# Patient Record
Sex: Male | Born: 1955 | Race: White | Hispanic: No | Marital: Married | Smoking: Never smoker
Health system: Southern US, Community
[De-identification: ages and names within clinical notes are randomized; demographics above are authoritative.]

## PROBLEM LIST (undated history)

## (undated) DIAGNOSIS — Z87442 Personal history of urinary calculi: Secondary | ICD-10-CM

## (undated) DIAGNOSIS — I517 Cardiomegaly: Secondary | ICD-10-CM

## (undated) DIAGNOSIS — F988 Other specified behavioral and emotional disorders with onset usually occurring in childhood and adolescence: Secondary | ICD-10-CM

## (undated) DIAGNOSIS — G4730 Sleep apnea, unspecified: Secondary | ICD-10-CM

## (undated) DIAGNOSIS — K219 Gastro-esophageal reflux disease without esophagitis: Secondary | ICD-10-CM

## (undated) DIAGNOSIS — F32A Depression, unspecified: Secondary | ICD-10-CM

## (undated) DIAGNOSIS — I872 Venous insufficiency (chronic) (peripheral): Secondary | ICD-10-CM

## (undated) DIAGNOSIS — I10 Essential (primary) hypertension: Secondary | ICD-10-CM

## (undated) DIAGNOSIS — E349 Endocrine disorder, unspecified: Secondary | ICD-10-CM

## (undated) DIAGNOSIS — F329 Major depressive disorder, single episode, unspecified: Secondary | ICD-10-CM

## (undated) DIAGNOSIS — N200 Calculus of kidney: Secondary | ICD-10-CM

## (undated) DIAGNOSIS — M1990 Unspecified osteoarthritis, unspecified site: Secondary | ICD-10-CM

## (undated) DIAGNOSIS — N400 Enlarged prostate without lower urinary tract symptoms: Secondary | ICD-10-CM

## (undated) HISTORY — DX: Gastro-esophageal reflux disease without esophagitis: K21.9

## (undated) HISTORY — PX: APPENDECTOMY: SHX54

## (undated) HISTORY — PX: OTHER SURGICAL HISTORY: SHX169

---

## 2001-04-02 HISTORY — PX: LITHOTRIPSY: SUR834

## 2007-04-03 HISTORY — PX: HERNIA REPAIR: SHX51

## 2008-02-12 ENCOUNTER — Observation Stay: Payer: Self-pay | Admitting: General Surgery

## 2008-11-18 ENCOUNTER — Ambulatory Visit: Payer: Self-pay | Admitting: Urology

## 2009-07-11 ENCOUNTER — Ambulatory Visit: Payer: Self-pay | Admitting: General Practice

## 2009-07-15 ENCOUNTER — Ambulatory Visit: Payer: Self-pay | Admitting: General Practice

## 2009-07-19 ENCOUNTER — Ambulatory Visit: Payer: Self-pay | Admitting: Urology

## 2011-09-18 ENCOUNTER — Ambulatory Visit: Payer: Self-pay | Admitting: Emergency Medicine

## 2011-12-28 ENCOUNTER — Ambulatory Visit: Payer: Self-pay | Admitting: Family Medicine

## 2012-02-07 DIAGNOSIS — N411 Chronic prostatitis: Secondary | ICD-10-CM | POA: Insufficient documentation

## 2013-03-21 ENCOUNTER — Emergency Department: Payer: Self-pay | Admitting: Emergency Medicine

## 2013-03-21 LAB — TROPONIN I: Troponin-I: <0.02 ng/mL

## 2013-03-21 LAB — COMPREHENSIVE METABOLIC PANEL
BUN: 15 mg/dL (ref 7–18)
Calcium, Total: 9 mg/dL (ref 8.5–10.1)
Chloride: 105 mmol/L (ref 98–107)
Co2: 30 mmol/L (ref 21–32)
EGFR (African American): >60
EGFR (Non-African Amer.): >60
Glucose: 103 mg/dL — ABNORMAL HIGH (ref 65–99)
Osmolality: 279 (ref 275–301)
SGOT(AST): 13 U/L — ABNORMAL LOW (ref 15–37)
SGPT (ALT): 24 U/L (ref 12–78)
Total Protein: 6.2 g/dL — ABNORMAL LOW (ref 6.4–8.2)

## 2013-03-21 LAB — PRO B NATRIURETIC PEPTIDE: B-Type Natriuretic Peptide: 21 pg/mL (ref 0–125)

## 2013-03-21 LAB — CBC
HCT: 40.8 % (ref 40.0–52.0)
HGB: 13.9 g/dL (ref 13.0–18.0)
MCH: 30 pg (ref 26.0–34.0)
MCHC: 34 g/dL (ref 32.0–36.0)
MCV: 88 fL (ref 80–100)
RBC: 4.61 10*6/uL (ref 4.40–5.90)
RDW: 13.3 % (ref 11.5–14.5)

## 2013-03-21 LAB — CK TOTAL AND CKMB (NOT AT ARMC): CK-MB: 4.4 ng/mL — ABNORMAL HIGH (ref 0.5–3.6)

## 2013-04-17 DIAGNOSIS — Z79899 Other long term (current) drug therapy: Secondary | ICD-10-CM | POA: Insufficient documentation

## 2013-05-03 HISTORY — PX: JOINT REPLACEMENT: SHX530

## 2013-05-15 ENCOUNTER — Ambulatory Visit: Payer: Self-pay | Admitting: Specialist

## 2013-05-16 ENCOUNTER — Emergency Department: Payer: Self-pay | Admitting: Emergency Medicine

## 2013-05-16 LAB — BASIC METABOLIC PANEL
Anion Gap: 2 — ABNORMAL LOW (ref 7–16)
BUN: 16 mg/dL (ref 7–18)
Calcium, Total: 8.8 mg/dL (ref 8.5–10.1)
Chloride: 107 mmol/L (ref 98–107)
Co2: 31 mmol/L (ref 21–32)
Creatinine: 1.35 mg/dL — ABNORMAL HIGH (ref 0.60–1.30)
EGFR (African American): >60
EGFR (Non-African Amer.): 59 — ABNORMAL LOW
Glucose: 88 mg/dL (ref 65–99)
Osmolality: 280 (ref 275–301)
Potassium: 3.7 mmol/L (ref 3.5–5.1)
Sodium: 140 mmol/L (ref 136–145)

## 2013-05-16 LAB — CBC WITH DIFFERENTIAL/PLATELET
Basophil #: 0 x10 3/mm 3
Basophil %: 0.3 %
Eosinophil #: 0.2 x10 3/mm 3
Eosinophil %: 1.5 %
HCT: 38.8 % — ABNORMAL LOW
HGB: 13 g/dL
Lymphocyte %: 10.5 %
Lymphs Abs: 1.2 x10 3/mm 3
MCH: 30.4 pg
MCHC: 33.5 g/dL
MCV: 91 fL
Monocyte #: 1.2 "x10 3/mm " — ABNORMAL HIGH
Monocyte %: 10.1 %
Neutrophil #: 9.2 x10 3/mm 3 — ABNORMAL HIGH
Neutrophil %: 77.6 %
Platelet: 233 x10 3/mm 3
RBC: 4.27 x10 6/mm 3 — ABNORMAL LOW
RDW: 13.6 %
WBC: 11.8 x10 3/mm 3 — ABNORMAL HIGH

## 2013-05-17 ENCOUNTER — Emergency Department: Payer: Self-pay | Admitting: Emergency Medicine

## 2013-05-17 LAB — CBC WITH DIFFERENTIAL/PLATELET
BASOS ABS: 0.1 10*3/uL (ref 0.0–0.1)
BASOS PCT: 0.7 %
EOS ABS: 0.1 10*3/uL (ref 0.0–0.7)
EOS PCT: 1.1 %
HCT: 37.6 % — ABNORMAL LOW (ref 40.0–52.0)
HGB: 12.8 g/dL — ABNORMAL LOW (ref 13.0–18.0)
Lymphocyte #: 1.2 10*3/uL (ref 1.0–3.6)
Lymphocyte %: 10.5 %
MCH: 30.7 pg (ref 26.0–34.0)
MCHC: 34.1 g/dL (ref 32.0–36.0)
MCV: 90 fL (ref 80–100)
MONOS PCT: 8.1 %
Monocyte #: 0.9 x10 3/mm (ref 0.2–1.0)
Neutrophil #: 8.7 10*3/uL — ABNORMAL HIGH (ref 1.4–6.5)
Neutrophil %: 79.6 %
PLATELETS: 232 10*3/uL (ref 150–440)
RBC: 4.18 10*6/uL — ABNORMAL LOW (ref 4.40–5.90)
RDW: 13.5 % (ref 11.5–14.5)
WBC: 11 10*3/uL — ABNORMAL HIGH (ref 3.8–10.6)

## 2013-05-21 LAB — CULTURE, BLOOD (SINGLE)

## 2013-08-06 DIAGNOSIS — R1310 Dysphagia, unspecified: Secondary | ICD-10-CM | POA: Insufficient documentation

## 2013-08-06 DIAGNOSIS — N200 Calculus of kidney: Secondary | ICD-10-CM | POA: Insufficient documentation

## 2013-08-06 DIAGNOSIS — G4730 Sleep apnea, unspecified: Secondary | ICD-10-CM | POA: Insufficient documentation

## 2013-08-06 DIAGNOSIS — F32A Depression, unspecified: Secondary | ICD-10-CM | POA: Insufficient documentation

## 2013-08-06 DIAGNOSIS — F988 Other specified behavioral and emotional disorders with onset usually occurring in childhood and adolescence: Secondary | ICD-10-CM | POA: Insufficient documentation

## 2013-09-08 ENCOUNTER — Ambulatory Visit: Payer: Self-pay | Admitting: Gastroenterology

## 2013-09-10 LAB — PATHOLOGY REPORT

## 2013-12-24 ENCOUNTER — Other Ambulatory Visit: Payer: Self-pay | Admitting: Orthopaedic Surgery

## 2013-12-24 DIAGNOSIS — M25511 Pain in right shoulder: Secondary | ICD-10-CM

## 2013-12-28 ENCOUNTER — Ambulatory Visit
Admission: RE | Admit: 2013-12-28 | Discharge: 2013-12-28 | Disposition: A | Discharge status: Home / Self Care | Payer: BC Managed Care – PPO | Source: Ambulatory Visit | Attending: Orthopaedic Surgery | Admitting: Orthopaedic Surgery

## 2013-12-28 ENCOUNTER — Other Ambulatory Visit: Payer: Self-pay | Admitting: Orthopaedic Surgery

## 2013-12-28 DIAGNOSIS — Z77018 Contact with and (suspected) exposure to other hazardous metals: Secondary | ICD-10-CM

## 2013-12-28 DIAGNOSIS — M25511 Pain in right shoulder: Secondary | ICD-10-CM

## 2014-02-12 DIAGNOSIS — E782 Mixed hyperlipidemia: Secondary | ICD-10-CM | POA: Insufficient documentation

## 2014-02-12 DIAGNOSIS — I517 Cardiomegaly: Secondary | ICD-10-CM

## 2014-02-12 DIAGNOSIS — I10 Essential (primary) hypertension: Secondary | ICD-10-CM | POA: Insufficient documentation

## 2014-02-12 HISTORY — DX: Cardiomegaly: I51.7

## 2014-05-03 DIAGNOSIS — M75102 Unspecified rotator cuff tear or rupture of left shoulder, not specified as traumatic: Secondary | ICD-10-CM | POA: Insufficient documentation

## 2014-07-24 NOTE — Op Note (Signed)
PATIENT NAME:  Brett Vance, RUETH MR#:  [id number] DATE OF BIRTH:  December 14, 1955  DATE OF PROCEDURE:  05/15/2013  PREOPERATIVE DIAGNOSIS: Degenerative arthritis, first metacarpocarpal joint left thumb.   POSTOPERATIVE DIAGNOSIS: Degenerative arthritis, first metacarpocarpal joint left thumb.   PROCEDURE PERFORMED: Excisional trapezium arthroplasty, left thumb.   SURGEON: Christophe Louis, M.D.   ANESTHESIA: General.   COMPLICATIONS: None.   TOURNIQUET TIME: 68 minutes.   DESCRIPTION OF PROCEDURE: Ancef 1 gram was given intravenously prior to the procedure. General anesthesia is induced. Left upper extremity is thoroughly prepped with alcohol and ChloraPrep and draped in standard sterile fashion. The extremity is wrapped out with the Esmarch bandage and pneumatic tourniquet elevated to 300 mmHg. Under loupe magnification, dorsal incision is made over the first metacarpocarpal joint. The dissection is carefully carried down to the capsule with preservation of the cutaneous nerves. Tendon is reflected to the side. Longitudinal incision is then made in the capsule, and the capsule is spared for later repair. The trapezium is identified. Central drill hole is made in the trapezium using the TPS bur. The entire trapezium is then removed piecemeal using the rongeur. Careful palpation demonstrates no residual evidence of bone. The wound is thoroughly irrigated multiple times. Three pieces of Gelfoam are then folded and compressed into the shape of a trapezium and sewn together with 3-0 Vicryl. This is then secured into the wound in the same position as the trapezium and secured and sewn down to the tendon at the base of the incision. Skin edges are then infiltrated with 0.5% plain Marcaine. The capsule is carefully repaired with 3-0 Vicryl. Several small 5-0 Vicryl sutures are placed subcutaneously. The skin is closed with a running subcuticular 3-0 Prolene suture. Soft bulky dressing is applied with a thumb  spica splint with the thumb in the appropriate position. The tourniquet is released, and the patient is returned to the recovery room in satisfactory condition having tolerated the procedure quite well.   ____________________________ Lucas Mallow, MD ces:jcm D: 05/15/2013 17:06:54 ET T: 05/15/2013 18:07:17 ET JOB#: [id number]  cc: Lucas Mallow, MD, <Dictator> Lucas Mallow MD ELECTRONICALLY SIGNED 05/18/2013 10:02

## 2015-02-04 DIAGNOSIS — M7582 Other shoulder lesions, left shoulder: Secondary | ICD-10-CM | POA: Insufficient documentation

## 2015-02-06 ENCOUNTER — Emergency Department
Admission: EM | Admit: 2015-02-06 | Discharge: 2015-02-06 | Disposition: A | Discharge status: Home / Self Care | Payer: BLUE CROSS/BLUE SHIELD | Attending: Emergency Medicine | Admitting: Emergency Medicine

## 2015-02-06 ENCOUNTER — Encounter: Payer: Self-pay | Admitting: Emergency Medicine

## 2015-02-06 DIAGNOSIS — S0501XA Injury of conjunctiva and corneal abrasion without foreign body, right eye, initial encounter: Secondary | ICD-10-CM | POA: Diagnosis not present

## 2015-02-06 DIAGNOSIS — X58XXXA Exposure to other specified factors, initial encounter: Secondary | ICD-10-CM | POA: Diagnosis not present

## 2015-02-06 DIAGNOSIS — I10 Essential (primary) hypertension: Secondary | ICD-10-CM | POA: Insufficient documentation

## 2015-02-06 DIAGNOSIS — Y998 Other external cause status: Secondary | ICD-10-CM | POA: Diagnosis not present

## 2015-02-06 DIAGNOSIS — S0591XA Unspecified injury of right eye and orbit, initial encounter: Secondary | ICD-10-CM | POA: Diagnosis present

## 2015-02-06 DIAGNOSIS — Y9389 Activity, other specified: Secondary | ICD-10-CM | POA: Insufficient documentation

## 2015-02-06 DIAGNOSIS — Y9289 Other specified places as the place of occurrence of the external cause: Secondary | ICD-10-CM | POA: Diagnosis not present

## 2015-02-06 HISTORY — DX: Essential (primary) hypertension: I10

## 2015-02-06 MED ORDER — EYE WASH OPHTH SOLN
OPHTHALMIC | Status: AC
  Filled 2015-02-06: qty 118

## 2015-02-06 MED ORDER — FLUORESCEIN SODIUM 1 MG OP STRP
ORAL_STRIP | OPHTHALMIC | Status: AC
  Filled 2015-02-06: qty 1

## 2015-02-06 MED ORDER — TETRACAINE HCL 0.5 % OP SOLN
OPHTHALMIC | Status: AC
  Filled 2015-02-06: qty 2

## 2015-02-06 MED ORDER — HYDROCODONE-ACETAMINOPHEN 5-325 MG PO TABS: 1.0000 | ORAL_TABLET | ORAL | Status: DC | PRN

## 2015-02-06 MED ORDER — SULFACETAMIDE SODIUM 10 % OP SOLN: 2.0000 [drp] | Freq: Four times a day (QID) | OPHTHALMIC | Status: DC

## 2015-02-06 NOTE — Discharge Instructions (Signed)

## 2015-02-06 NOTE — ED Provider Notes (Signed)
[facility] Emergency Department Provider Note  ____________________________________________  Time seen: Approximately 5:26 PM  I have reviewed the triage vital signs and the nursing notes.   HISTORY  Chief Complaint Eye Injury   HPI Brett Vance. is a 59 y.o. male who presents emergency room for evaluation of right eye blurriness and pain secondary to poking himself in the eye with a dried flower stop. Patient denies any other complaints at this time.   Past Medical History  Diagnosis Date  . Hypertension     There are no active problems to display for this patient.   No past surgical history on file.  Current Outpatient Rx  Name  Route  Sig  Dispense  Refill  . HYDROcodone-acetaminophen (NORCO) 5-325 MG tablet   Oral   Take 1-2 tablets by mouth every 4 (four) hours as needed for moderate pain.   15 tablet   0   . sulfacetamide (BLEPH-10) 10 % ophthalmic solution   Right Eye   Place 2 drops into the right eye 4 (four) times daily.   5 mL   0     Allergies Review of patient's allergies indicates no known allergies.  No family history on file.  Social History Social History  Substance Use Topics  . Smoking status: Never Smoker   . Smokeless tobacco: Never Used  . Alcohol Use: No    Review of Systems Constitutional: No fever/chills Eyes: Positive visual changes right eye. ENT: No sore throat. Cardiovascular: Denies chest pain. Respiratory: Denies shortness of breath. Gastrointestinal: No abdominal pain.  No nausea, no vomiting.  No diarrhea.  No constipation. Genitourinary: Negative for dysuria. Musculoskeletal: Negative for back pain. Skin: Negative for rash. Neurological: Negative for headaches, focal weakness or numbness.  10-point ROS otherwise negative.  ____________________________________________   PHYSICAL EXAM:  VITAL SIGNS: ED Triage Vitals  Enc Vitals Group     BP 02/06/15 1651 154/76 mmHg     Pulse  Rate 02/06/15 1651 89     Resp 02/06/15 1651 18     Temp 02/06/15 1651 98.8 F (37.1 C)     Temp Source 02/06/15 1651 Oral     SpO2 02/06/15 1651 98 %     Weight 02/06/15 1651 170 lb (77.111 kg)     Height 02/06/15 1651 5\' 10"  (1.778 m)     Head Cir --      Peak Flow --      Pain Score 02/06/15 1700 2     Pain Loc --      Pain Edu? --      Excl. in [place]? --     Constitutional: Alert and oriented. Well appearing and in no acute distress. Eyes: Conjunctivae are normal. PERRL. EOMI. Head: Atraumatic. Nose: No congestion/rhinnorhea. Mouth/Throat: Mucous membranes are moist.  Oropharynx non-erythematous. Neck: No stridor.   Cardiovascular: Normal rate, regular rhythm. Grossly normal heart sounds.  Good peripheral circulation. Respiratory: Normal respiratory effort.  No retractions. Lungs CTAB. Gastrointestinal: Soft and nontender. No distention. No abdominal bruits. No CVA tenderness. Musculoskeletal: No lower extremity tenderness nor edema.  No joint effusions. Neurologic:  Normal speech and language. No gross focal neurologic deficits are appreciated. No gait instability. Skin:  Skin is warm, dry and intact. No rash noted. Psychiatric: Mood and affect are normal. Speech and behavior are normal.  ____________________________________________   LABS (all labs ordered are listed, but only abnormal results are displayed)  Labs Reviewed - No data to display ____________________________________________  PROCEDURES  Procedure(s) performed: YES Tetracaine eyedrops applied, fluorescein strip applied visualized right eye with Woods lamp. Corneal abrasion extending from approximately 9:00 to 4:00 noted. Eye irrigated with approximately 15 cc of normal saline eyewash.  Critical Care performed: No  ____________________________________________   INITIAL IMPRESSION / ASSESSMENT AND PLAN / ED COURSE  Pertinent labs & imaging results that were available during my care of the patient  were reviewed by me and considered in my medical decision making (see chart for details).  Acute corneal abrasion. Rx given for sodium Sulamyd eyedrops as well as hydrocodone as needed for pain. Patient to follow up with PCP or ophthalmology as needed. Patient voices no other emergency medical complaints at this time. ____________________________________________   FINAL CLINICAL IMPRESSION(S) / ED DIAGNOSES  Final diagnoses:  Corneal abrasion, right, initial encounter      Arlyss Repress, PA-C 02/06/15 1741  Orbie Pyo, MD 02/06/15 2155

## 2015-02-06 NOTE — ED Notes (Signed)
Pt discharged home after verbalizing understanding of discharge instructions; nad noted. 

## 2015-02-06 NOTE — ED Notes (Signed)
Dried flower stalk poked right eye at 1630.  Now vision blurry to right eye.

## 2015-02-07 ENCOUNTER — Other Ambulatory Visit: Payer: Self-pay | Admitting: Surgery

## 2015-02-07 DIAGNOSIS — M75102 Unspecified rotator cuff tear or rupture of left shoulder, not specified as traumatic: Secondary | ICD-10-CM

## 2015-02-07 DIAGNOSIS — M7582 Other shoulder lesions, left shoulder: Secondary | ICD-10-CM

## 2015-02-22 ENCOUNTER — Ambulatory Visit
Admission: RE | Admit: 2015-02-22 | Discharge: 2015-02-22 | Disposition: A | Discharge status: Home / Self Care | Payer: BLUE CROSS/BLUE SHIELD | Source: Ambulatory Visit | Attending: Surgery | Admitting: Surgery

## 2015-02-22 DIAGNOSIS — M25512 Pain in left shoulder: Secondary | ICD-10-CM | POA: Diagnosis present

## 2015-02-22 DIAGNOSIS — M19012 Primary osteoarthritis, left shoulder: Secondary | ICD-10-CM | POA: Diagnosis not present

## 2015-02-22 DIAGNOSIS — M75102 Unspecified rotator cuff tear or rupture of left shoulder, not specified as traumatic: Secondary | ICD-10-CM | POA: Diagnosis present

## 2015-02-22 DIAGNOSIS — M25812 Other specified joint disorders, left shoulder: Secondary | ICD-10-CM | POA: Diagnosis not present

## 2015-02-22 DIAGNOSIS — M7582 Other shoulder lesions, left shoulder: Secondary | ICD-10-CM

## 2015-07-12 ENCOUNTER — Emergency Department
Admission: EM | Admit: 2015-07-12 | Discharge: 2015-07-12 | Disposition: A | Discharge status: Home / Self Care | Payer: BLUE CROSS/BLUE SHIELD | Attending: Emergency Medicine | Admitting: Emergency Medicine

## 2015-07-12 ENCOUNTER — Encounter: Payer: Self-pay | Admitting: Emergency Medicine

## 2015-07-12 DIAGNOSIS — I10 Essential (primary) hypertension: Secondary | ICD-10-CM | POA: Insufficient documentation

## 2015-07-12 DIAGNOSIS — Y999 Unspecified external cause status: Secondary | ICD-10-CM | POA: Insufficient documentation

## 2015-07-12 DIAGNOSIS — Y939 Activity, unspecified: Secondary | ICD-10-CM | POA: Insufficient documentation

## 2015-07-12 DIAGNOSIS — W269XXA Contact with unspecified sharp object(s), initial encounter: Secondary | ICD-10-CM | POA: Diagnosis not present

## 2015-07-12 DIAGNOSIS — Y929 Unspecified place or not applicable: Secondary | ICD-10-CM | POA: Insufficient documentation

## 2015-07-12 DIAGNOSIS — S61217A Laceration without foreign body of left little finger without damage to nail, initial encounter: Secondary | ICD-10-CM | POA: Diagnosis present

## 2015-07-12 MED ORDER — LIDOCAINE HCL (PF) 1 % IJ SOLN
INTRAMUSCULAR | Status: AC
  Administered 2015-07-12: 5 mL
  Filled 2015-07-12: qty 5

## 2015-07-12 MED ORDER — TETANUS-DIPHTH-ACELL PERTUSSIS 5-2.5-18.5 LF-MCG/0.5 IM SUSP
0.5000 mL | Freq: Once | INTRAMUSCULAR | Status: AC
  Administered 2015-07-12: 0.5 mL via INTRAMUSCULAR
  Filled 2015-07-12: qty 0.5

## 2015-07-12 MED ORDER — LIDOCAINE HCL (PF) 1 % IJ SOLN
5.0000 mL | Freq: Once | INTRAMUSCULAR | Status: AC
Start: 2015-07-12 — End: 2015-07-12
  Administered 2015-07-12: 5 mL

## 2015-07-12 NOTE — ED Provider Notes (Signed)
[facility] Emergency Department Provider Note  ____________________________________________  Time seen: 2:45 AM  I have reviewed the triage vital signs and the nursing notes.   HISTORY  Chief Complaint Laceration     HPI Brett Rang Ronak Tota. is a 60 y.o. male presents with left fifth finger laceration approximately 1.5inches sustained at 12:00 while attempting to cut a zip tie.     Past Medical History  Diagnosis Date  . Hypertension     There are no active problems to display for this patient.   History reviewed. No pertinent past surgical history.  Current Outpatient Rx  Name  Route  Sig  Dispense  Refill  . HYDROcodone-acetaminophen (NORCO) 5-325 MG tablet   Oral   Take 1-2 tablets by mouth every 4 (four) hours as needed for moderate pain.   15 tablet   0   . sulfacetamide (BLEPH-10) 10 % ophthalmic solution   Right Eye   Place 2 drops into the right eye 4 (four) times daily.   5 mL   0     Allergies No known drug allergies No family history on file.  Social History Social History  Substance Use Topics  . Smoking status: Never Smoker   . Smokeless tobacco: Never Used  . Alcohol Use: No    Review of Systems  Constitutional: Negative for fever. Eyes: Negative for visual changes. ENT: Negative for sore throat. Cardiovascular: Negative for chest pain. Respiratory: Negative for shortness of breath. Gastrointestinal: Negative for abdominal pain, vomiting and diarrhea. Genitourinary: Negative for dysuria. Musculoskeletal: Negative for back pain. Skin: Negative for rash. Positive for Left fifth finger laceration Neurological: Negative for headaches, focal weakness or numbness.   10-point ROS otherwise negative.  ____________________________________________   PHYSICAL EXAM:  VITAL SIGNS: ED Triage Vitals  Enc Vitals Group     BP 07/12/15 0118 160/78 mmHg     Pulse Rate 07/12/15 0118 69     Resp 07/12/15 0118 18      Temp 07/12/15 0118 97.9 F (36.6 C)     Temp Source 07/12/15 0118 Oral     SpO2 07/12/15 0118 100 %     Weight 07/12/15 0118 170 lb (77.111 kg)     Height 07/12/15 0118 5\' 10"  (1.778 m)     Head Cir --      Peak Flow --      Pain Score --      Pain Loc --      Pain Edu? --      Excl. in [place]? --    Constitutional: Alert and oriented. Well appearing and in no distress. Eyes: Conjunctivae are normal. PERRL. Normal extraocular movements. ENT   Head: Normocephalic and atraumatic. Neurologic:  Normal speech and language. No gross focal neurologic deficits are appreciated. Speech is normal.  Skin: Linear 1.5 inch laceration noted dorsal aspect of the left fifth finger Psychiatric: Mood and affect are normal. Speech and behavior are normal. Patient exhibits appropriate insight and judgment.    PROCEDURES  Procedure(s) performed: LACERATION REPAIR Performed by: Brett Vance Authorized by: Brett Vance Consent: Verbal consent obtained. Risks and benefits: risks, benefits and alternatives were discussed Consent given by: patient Patient identity confirmed: provided demographic data Prepped and Draped in normal sterile fashion Wound explored  Laceration Location: Left fifth finger  Laceration Length: 1.5 inches  No Foreign Bodies seen or palpated  Anesthesia: local infiltration  Local anesthetic: lidocaine 1%  Anesthetic total: 58ml  Irrigation method: syringe Amount  of cleaning: standard  Skin closure: 6-0 nylon   Number of sutures: 5   Technique: Simple interrupted   Patient tolerance: Patient tolerated the procedure well with no immediate complications.     ____________________________________________   INITIAL IMPRESSION / ASSESSMENT AND PLAN / ED COURSE  Pertinent labs & imaging results that were available during my care of the patient were reviewed by me and considered in my medical decision making (see chart for details).  Tetanus  administered  ____________________________________________   FINAL CLINICAL IMPRESSION(S) / ED DIAGNOSES  Final diagnoses:  Laceration of fifth finger, left, initial encounter      Brett Hams, MD 07/12/15 [phone]

## 2015-07-12 NOTE — ED Notes (Signed)

## 2015-07-12 NOTE — ED Notes (Signed)
Patient ambulatory to triage with steady gait, without difficulty or distress noted; pt reports  PTA cut left pinkie with knife; approx 1.5" lac noted with scant bleeding

## 2015-07-12 NOTE — Discharge Instructions (Signed)

## 2015-07-12 NOTE — ED Notes (Signed)
MD at bedside. 

## 2016-02-22 DIAGNOSIS — K219 Gastro-esophageal reflux disease without esophagitis: Secondary | ICD-10-CM | POA: Insufficient documentation

## 2016-06-11 ENCOUNTER — Other Ambulatory Visit: Payer: Self-pay | Admitting: Internal Medicine

## 2016-06-11 DIAGNOSIS — R1310 Dysphagia, unspecified: Secondary | ICD-10-CM

## 2016-06-25 ENCOUNTER — Encounter: Payer: Self-pay | Admitting: *Deleted

## 2016-06-25 ENCOUNTER — Emergency Department
Admission: EM | Admit: 2016-06-25 | Discharge: 2016-06-26 | Disposition: A | Discharge status: Other Acute Inpatient Hospital | Payer: BLUE CROSS/BLUE SHIELD | Attending: Emergency Medicine | Admitting: Emergency Medicine

## 2016-06-25 DIAGNOSIS — Z79899 Other long term (current) drug therapy: Secondary | ICD-10-CM | POA: Diagnosis not present

## 2016-06-25 DIAGNOSIS — X788XXA Intentional self-harm by other sharp object, initial encounter: Secondary | ICD-10-CM | POA: Insufficient documentation

## 2016-06-25 DIAGNOSIS — R45851 Suicidal ideations: Secondary | ICD-10-CM

## 2016-06-25 DIAGNOSIS — I10 Essential (primary) hypertension: Secondary | ICD-10-CM | POA: Insufficient documentation

## 2016-06-25 DIAGNOSIS — Y999 Unspecified external cause status: Secondary | ICD-10-CM | POA: Diagnosis not present

## 2016-06-25 DIAGNOSIS — S71112A Laceration without foreign body, left thigh, initial encounter: Secondary | ICD-10-CM | POA: Insufficient documentation

## 2016-06-25 DIAGNOSIS — F32A Depression, unspecified: Secondary | ICD-10-CM

## 2016-06-25 DIAGNOSIS — F329 Major depressive disorder, single episode, unspecified: Secondary | ICD-10-CM | POA: Insufficient documentation

## 2016-06-25 DIAGNOSIS — Y929 Unspecified place or not applicable: Secondary | ICD-10-CM | POA: Insufficient documentation

## 2016-06-25 DIAGNOSIS — Y939 Activity, unspecified: Secondary | ICD-10-CM | POA: Insufficient documentation

## 2016-06-25 DIAGNOSIS — Z7289 Other problems related to lifestyle: Secondary | ICD-10-CM

## 2016-06-25 LAB — COMPREHENSIVE METABOLIC PANEL
ALT: 39 U/L (ref 17–63)
AST: 23 U/L (ref 15–41)
Albumin: 3.9 g/dL (ref 3.5–5.0)
Alkaline Phosphatase: 44 U/L (ref 38–126)
Anion gap: 7 (ref 5–15)
BUN: 24 mg/dL — ABNORMAL HIGH (ref 6–20)
CHLORIDE: 104 mmol/L (ref 101–111)
CO2: 26 mmol/L (ref 22–32)
CREATININE: 0.98 mg/dL (ref 0.61–1.24)
Calcium: 9.3 mg/dL (ref 8.9–10.3)
GFR calc Af Amer: >60 mL/min (ref >60)
GLUCOSE: 101 mg/dL — AB (ref 65–99)
Potassium: 4.2 mmol/L (ref 3.5–5.1)
SODIUM: 137 mmol/L (ref 135–145)
Total Bilirubin: 0.5 mg/dL (ref 0.3–1.2)
Total Protein: 6.8 g/dL (ref 6.5–8.1)

## 2016-06-25 LAB — URINE DRUG SCREEN, QUALITATIVE (ARMC ONLY)
Amphetamines, Ur Screen: POSITIVE — AB
Barbiturates, Ur Screen: NOT DETECTED
Benzodiazepine, Ur Scrn: NOT DETECTED
CANNABINOID 50 NG, UR ~~LOC~~: NOT DETECTED
COCAINE METABOLITE, UR ~~LOC~~: NOT DETECTED
MDMA (ECSTASY) UR SCREEN: NOT DETECTED
Methadone Scn, Ur: NOT DETECTED
Opiate, Ur Screen: NOT DETECTED
PHENCYCLIDINE (PCP) UR S: NOT DETECTED
Tricyclic, Ur Screen: NOT DETECTED

## 2016-06-25 LAB — CBC
HEMATOCRIT: 40.4 % (ref 40.0–52.0)
HEMOGLOBIN: 13.6 g/dL (ref 13.0–18.0)
MCH: 29.2 pg (ref 26.0–34.0)
MCHC: 33.6 g/dL (ref 32.0–36.0)
MCV: 86.9 fL (ref 80.0–100.0)
Platelets: 210 10*3/uL (ref 150–440)
RBC: 4.65 MIL/uL (ref 4.40–5.90)
RDW: 15.4 % — ABNORMAL HIGH (ref 11.5–14.5)
WBC: 9.6 10*3/uL (ref 3.8–10.6)

## 2016-06-25 LAB — ETHANOL: Alcohol, Ethyl (B): <5 mg/dL (ref <5)

## 2016-06-25 LAB — ACETAMINOPHEN LEVEL: Acetaminophen (Tylenol), Serum: <10 ug/mL — ABNORMAL LOW (ref 10–30)

## 2016-06-25 LAB — SALICYLATE LEVEL: Salicylate Lvl: <7 mg/dL (ref 2.8–30.0)

## 2016-06-25 NOTE — BH Assessment (Addendum)
Assessment Note  Brett Rang Roald Lukacs. is an 61 y.o. male. Brett Vance arrived to the ED under IVC with [facility] police.  He states that he was awoken by his family members and they told him that he had 3 choices, to go with them to talk to someone, go with the [facility], or call the mobile unit.  He states that he was taken to [place].  He states that his family does not like what he does. He reports having trouble processing some set backs in life and at times he breaks things.  He states that he needs to go through the process of finding someone to talk to. He states that he felt that now it was a big rush to do that. 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 He likened it to being in "Dad mode" for 10 years constantly and now he is not and he has lost his identity. States that he feels like he is walking in [place].  He reports that he ruminates and gets overwhelmed.  He would not identify his symptoms as being depressed.  He denied symptoms of anxiety.  He denied having auditory or visual hallucinations. He denied having suicidal or homicidal ideation or intent. He is currently working full time, and recently he has been working excessive amounts of overtime and not completing things he needs to do outside of work. He denied the use of alcohol or drugs.  IVC paperwork reports "61 year old male who has had escalating anger and dangerous behaviors.  He has talked to his wife about killing himself by hanging himself in the backyard.  He had a prior attempt 5 years ago by carbon monoxide in the garage.  He said currently he is so much worse than then. She is afraid of him.  He broke down her bedroom door recently.  He is a danger to self and others".  Diagnosis: Depression  Past Medical History:  Past Medical History:  Diagnosis Date  . Hypertension     No past surgical history on  file.  Family History: No family history on file.  Social History:  reports that he has never smoked. He has never used smokeless tobacco. He reports that he does not drink alcohol or use drugs.  Additional Social History:  Alcohol / Drug Use History of alcohol / drug use?: No history of alcohol / drug abuse  CIWA: CIWA-Ar BP: (!) 164/81 Pulse Rate: 66 COWS:    Allergies: No Known Allergies  Home Medications:  (Not in a hospital admission)  OB/GYN Status:  No LMP for male patient.  General Assessment Data Location of Assessment: [facility] ED TTS Assessment: In system Is this a Tele or Face-to-Face Assessment?: Face-to-Face Is this an Initial Assessment or a Re-assessment for this encounter?: Initial Assessment Marital status: Married [place] name: n/a Is patient pregnant?: No Pregnancy Status: No Living Arrangements: Spouse/significant other Can pt return to current living arrangement?: Yes Admission Status: Involuntary Is patient capable of signing voluntary admission?: Yes Referral Source: Self/Family/Friend Insurance type: [place] Screening Exam ([place]) Medical Exam completed: Yes  Crisis Care Plan Living Arrangements: Spouse/significant other Legal Guardian: Other: (Self) Name of Psychiatrist: None at this time Name of Therapist: None at this time  Education Status Is patient currently in school?: No Current Grade: n/a Highest grade of  school patient has completed: BA in forestry Name of school: [employer] Contact person: n/a  Risk to self with the past 6 months Suicidal Ideation: No Has patient been a risk to self within the past 6 months prior to admission? : No Suicidal Intent: No Has patient had any suicidal intent within the past 6 months prior to admission? : No Is patient at risk for suicide?: No Suicidal Plan?: No Has patient had any suicidal plan within the past 6 months prior to admission? : No Access to Means: No What  has been your use of drugs/alcohol within the last 12 months?: denied use of alcohol or drugs Previous Attempts/Gestures: No How many times?: 0 Other Self Harm Risks: denied Triggers for Past Attempts: None known Intentional Self Injurious Behavior: None Family Suicide History: No Recent stressful life event(s): Loss (Comment) (Loss of position in the church) Persecutory voices/beliefs?: No Depression: Yes Depression Symptoms: Feeling worthless/self pity Substance abuse history and/or treatment for substance abuse?: No Suicide prevention information given to non-admitted patients: Not applicable  Risk to Others within the past 6 months Homicidal Ideation: No Does patient have any lifetime risk of violence toward others beyond the six months prior to admission? : No Thoughts of Harm to Others: No Current Homicidal Intent: No Current Homicidal Plan: No Access to Homicidal Means: No Identified Victim: None identified History of harm to others?: No Assessment of Violence: On admission Violent Behavior Description: reported as throwing objects in his home Does patient have access to weapons?: Yes (Comment) (bb gun, box cutters) Criminal Charges Pending?: No Does patient have a court date: No Is patient on probation?: No  Psychosis Hallucinations: None noted Delusions: None noted  Mental Status Report Appearance/Hygiene: In scrubs Eye Contact: Fair Motor Activity: Unremarkable Speech: Logical/coherent Level of Consciousness: Alert Mood: Pleasant Affect: Appropriate to circumstance Anxiety Level: None Thought Processes: Coherent Judgement: Unimpaired Orientation: Person, Place, Time, Situation Obsessive Compulsive Thoughts/Behaviors: None  Cognitive Functioning Concentration: Normal Memory: Recent Intact IQ: Average Insight: Fair Impulse Control: Fair Appetite: Good Sleep: Unable to Assess (Reports sleep as being erratic) Vegetative Symptoms: None  ADLScreening [facility]  Assessment Services) Patient's cognitive ability adequate to safely complete daily activities?: Yes Patient able to express need for assistance with ADLs?: Yes Independently performs ADLs?: Yes (appropriate for developmental age)  Prior Inpatient Therapy Prior Inpatient Therapy: No Prior Therapy Dates: n/a Prior Therapy Facilty/Provider(s): n/a Reason for Treatment: n/a  Prior Outpatient Therapy Prior Outpatient Therapy: Yes Prior Therapy Dates: 2015 Prior Therapy Facilty/Provider(s): [facility] - Pastoral counselor Reason for Treatment: Depression Does patient have an ACCT team?: No Does patient have Intensive In-House Services?  : No Does patient have [facility] services? : No Does patient have P4CC services?: No  ADL Screening (condition at time of admission) Patient's cognitive ability adequate to safely complete daily activities?: Yes Patient able to express need for assistance with ADLs?: Yes Independently performs ADLs?: Yes (appropriate for developmental age)       Abuse/Neglect Assessment (Assessment to be complete while patient is alone) Physical Abuse: Denies Verbal Abuse: Denies Sexual Abuse: Denies Exploitation of patient/patient's resources: Denies Self-Neglect: Denies     Regulatory affairs officer (For Healthcare) Does Patient Have a Medical Advance Directive?: No    Additional Information 1:1 In Past 12 Months?: No CIRT Risk: No Elopement Risk: No Does patient have medical clearance?: Yes     Disposition:  Disposition Initial Assessment Completed for this Encounter: Yes Disposition of Patient: Other dispositions  On Site Evaluation by:  Reviewed with Physician:    Elmer Bales 06/25/2016 9:25 PM

## 2016-06-25 NOTE — ED Notes (Signed)
Pt dressed out in paper scrub by Kandee Keen, RN

## 2016-06-25 NOTE — ED Notes (Signed)
Pt reports that he has been dealing with build up frustration with life over time, that led to depressions. States things get out of control sometimes, states he cuts self, superficial cuts noted on thighs. Pt also reports that he breaks things sometimes in frustration. Pt denies SI/HI at this time. Pt calm and collected at this time.

## 2016-06-25 NOTE — ED Provider Notes (Signed)
[facility] Emergency Department Provider Note  ____________________________________________  Time seen: Approximately 9:30 PM  I have reviewed the triage vital signs and the nursing notes.   HISTORY  Chief Complaint Behavior Problem    HPI Brett Vance. is a 61 y.o. male with a long history of depression, recently placed on citalopram in February, brought from [place] for aggressive behavior, suicidal ideations, and self cutting. The patient reports that he has had long-term depression but recently it has been worse. He says that he has not had any recent suicidal ideations but has had them in the past. Last tetanus shot was last year, and the patient has no medical complaints at this time.   Past Medical History:  Diagnosis Date  . Hypertension     There are no active problems to display for this patient.   No past surgical history on file.  Current Outpatient Rx  . Order #: [id number] Class: Print  . Order #: [id number] Class: Print    Allergies Patient has no known allergies.  No family history on file.  Social History Social History  Substance Use Topics  . Smoking status: Never Smoker  . Smokeless tobacco: Never Used  . Alcohol use No    Review of Systems Constitutional: No fever/chills. Eyes: No visual changes. ENT: No sore throat. No congestion or rhinorrhea. Cardiovascular: Denies chest pain. Denies palpitations. Respiratory: Denies shortness of breath.  No cough. Gastrointestinal: No abdominal pain.  No nausea, no vomiting.  No diarrhea.  No constipation. Genitourinary: Negative for dysuria. Musculoskeletal: Negative for back pain. Skin: Positive for self cutting in the left thigh. Neurological: Negative for headaches. No focal numbness, tingling or weakness.  Psychiatric:Positive depression. Denies SI but has had them in the past. Denies HI or hallucinations.  10-point ROS otherwise  negative.  ____________________________________________   PHYSICAL EXAM:  VITAL SIGNS: ED Triage Vitals  Enc Vitals Group     BP 06/25/16 2013 (!) 164/81     Pulse Rate 06/25/16 2013 66     Resp 06/25/16 2013 20     Temp 06/25/16 2013 98.3 F (36.8 C)     Temp Source 06/25/16 2013 Oral     SpO2 06/25/16 2013 97 %     Weight 06/25/16 2014 175 lb (79.4 kg)     Height 06/25/16 2014 5\' 9"  (1.753 m)     Head Circumference --      Peak Flow --      Pain Score 06/25/16 2050 0     Pain Loc --      Pain Edu? --      Excl. in [place]? --     Constitutional: Alert and oriented. Well appearing and in no acute distress. Answers questions appropriately. Eyes: Conjunctivae are normal.  EOMI. No scleral icterus. Head: Atraumatic. Nose: No congestion/rhinnorhea. Mouth/Throat: Mucous membranes are moist.  Neck: No stridor.  Supple.  No meningismus P Cardiovascular: Normal rate, regular rhythm. No murmurs, rubs or gallops.  Respiratory: Normal respiratory effort.  No accessory muscle use or retractions. Lungs CTAB.  No wheezes, rales or ronchi. Gastrointestinal: Soft, nontender and nondistended.  No guarding or rebound.  No peritoneal signs. Musculoskeletal: No LE edema.  Neurologic:  A&Ox3.  Speech is clear.  Face and smile are symmetric.  EOMI.  Moves all extremities well. Skin:  Skin is warm, dry. Multiple superficial lacerations to the left thigh in a crisscross pattern diffusely without evidence of significant erythema, swelling, or exudate.Marland Kitchen Psychiatric: Depressed mood and flat  affect with poor insight. No SI, HI or hallucinations at this time. Should speech.  ____________________________________________   LABS (all labs ordered are listed, but only abnormal results are displayed)  Labs Reviewed  COMPREHENSIVE METABOLIC PANEL - Abnormal; Notable for the following:       Result Value   Glucose, Bld 101 (*)    BUN 24 (*)    All other components within normal limits  ACETAMINOPHEN  LEVEL - Abnormal; Notable for the following:    Acetaminophen (Tylenol), Serum <10 (*)    All other components within normal limits  CBC - Abnormal; Notable for the following:    RDW 15.4 (*)    All other components within normal limits  URINE DRUG SCREEN, QUALITATIVE ([facility] ONLY) - Abnormal; Notable for the following:    Amphetamines, Ur Screen POSITIVE (*)    All other components within normal limits  ETHANOL  SALICYLATE LEVEL   ____________________________________________  EKG  Not indicated ____________________________________________  RADIOLOGY  No results found.  ____________________________________________   PROCEDURES  Procedure(s) performed: None  Procedures  Critical Care performed: No ____________________________________________   INITIAL IMPRESSION / ASSESSMENT AND PLAN / ED COURSE  Pertinent labs & imaging results that were available during my care of the patient were reviewed by me and considered in my medical decision making (see chart for details).  61 y.o. male with a history of depression, recently started on citalopram, presenting with aggressive behavior are HA, self cutting to the left thigh. At this time, the patient denies any SI but his recent behavior has been erratic and he has poor insight today. His cuts did not require any suturing, and his tetanus is up-to-date. He has been medically cleared for psychiatric disposition at this time.  ____________________________________________  FINAL CLINICAL IMPRESSION(S) / ED DIAGNOSES  Final diagnoses:  Deliberate self-cutting  Suicidal ideations  Depression, unspecified depression type         NEW MEDICATIONS STARTED DURING THIS VISIT:  New Prescriptions   No medications on file      Eula Listen, MD 06/25/16 2133

## 2016-06-25 NOTE — ED Triage Notes (Signed)
Pt brought in by bpd.  Pt is IVC from [place].  Pt has escalating anger and behavior.  Pt denies SI or HI.  Pt denies etoh use or drug use.  Pt calm and cooperative.

## 2016-06-26 ENCOUNTER — Ambulatory Visit: Admission: RE | Admit: 2016-06-26 | Payer: BLUE CROSS/BLUE SHIELD | Source: Ambulatory Visit | Admitting: Psychiatry

## 2016-06-26 ENCOUNTER — Inpatient Hospital Stay
Admit: 2016-06-26 | Discharge: 2016-06-29 | DRG: 885 | Disposition: A | Discharge status: Home / Self Care | Payer: BLUE CROSS/BLUE SHIELD | Attending: Psychiatry | Admitting: Psychiatry

## 2016-06-26 DIAGNOSIS — F332 Major depressive disorder, recurrent severe without psychotic features: Secondary | ICD-10-CM | POA: Diagnosis not present

## 2016-06-26 DIAGNOSIS — I10 Essential (primary) hypertension: Secondary | ICD-10-CM | POA: Diagnosis present

## 2016-06-26 DIAGNOSIS — Z818 Family history of other mental and behavioral disorders: Secondary | ICD-10-CM

## 2016-06-26 DIAGNOSIS — S71112A Laceration without foreign body, left thigh, initial encounter: Secondary | ICD-10-CM | POA: Diagnosis not present

## 2016-06-26 DIAGNOSIS — R45851 Suicidal ideations: Secondary | ICD-10-CM

## 2016-06-26 DIAGNOSIS — Z79899 Other long term (current) drug therapy: Secondary | ICD-10-CM

## 2016-06-26 MED ORDER — IRBESARTAN 150 MG PO TABS
300.0000 mg | ORAL_TABLET | Freq: Every day | ORAL | Status: DC
  Administered 2016-06-26 – 2016-06-29 (×4): 300 mg via ORAL
  Filled 2016-06-26 (×2): qty 2
  Filled 2016-06-26: qty 1
  Filled 2016-06-26: qty 2

## 2016-06-26 MED ORDER — TAMSULOSIN HCL 0.4 MG PO CAPS
ORAL_CAPSULE | ORAL | Status: AC
  Administered 2016-06-26: 0.4 mg via ORAL
  Filled 2016-06-26: qty 1

## 2016-06-26 MED ORDER — ALUM & MAG HYDROXIDE-SIMETH 200-200-20 MG/5ML PO SUSP: 30.0000 mL | ORAL | Status: DC | PRN

## 2016-06-26 MED ORDER — TRAZODONE HCL 50 MG PO TABS
50.0000 mg | ORAL_TABLET | Freq: Every evening | ORAL | Status: DC | PRN
  Administered 2016-06-26 – 2016-06-28 (×3): 50 mg via ORAL
  Filled 2016-06-26 (×3): qty 1

## 2016-06-26 MED ORDER — TAMSULOSIN HCL 0.4 MG PO CAPS
0.4000 mg | ORAL_CAPSULE | Freq: Every day | ORAL | Status: DC
  Administered 2016-06-26: 0.4 mg via ORAL

## 2016-06-26 MED ORDER — ACETAMINOPHEN 325 MG PO TABS: 650.0000 mg | ORAL_TABLET | Freq: Four times a day (QID) | ORAL | Status: DC | PRN

## 2016-06-26 MED ORDER — SERTRALINE HCL 25 MG PO TABS
25.0000 mg | ORAL_TABLET | Freq: Every day | ORAL | Status: DC
  Administered 2016-06-26 – 2016-06-28 (×3): 25 mg via ORAL
  Filled 2016-06-26 (×3): qty 1

## 2016-06-26 MED ORDER — MAGNESIUM HYDROXIDE 400 MG/5ML PO SUSP: 30.0000 mL | Freq: Every day | ORAL | Status: DC | PRN

## 2016-06-26 MED ORDER — NICOTINE POLACRILEX 2 MG MT GUM
CHEWING_GUM | OROMUCOSAL | Status: AC
  Administered 2016-06-26: 10:00:00
  Filled 2016-06-26: qty 1

## 2016-06-26 NOTE — BH Assessment (Signed)
Writer received a phone call from [place], about the patient and the concerns his family had about his safety. For the last several weeks, family have noticed a significant change in his behaviors and mood. 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 Since then he have been irritable and easily agitated, particularly towards his wife. Due to his behaviors and risk factors, his brother came to [place] from [place], to talk to him, alone with his wife and son. On last night (06/24/2016), he was on the internet, trying to find the cardiac artery in his thigh, so he can cut it and bleed to death. When his wife confronted him about it, he told her he would hang his self in the woods, so she would not have to clean the blood.  Per the report of [facility], the family told them the patient is "very manipulative and smart." They are worried he's going "talk his way out of this and he's going to come home and kill his self..."

## 2016-06-26 NOTE — BH Assessment (Signed)
Per [facility] [place] Call, patient meets inpatient criteria. ER MD (Dr. Jimmye Norman) informed of this decision. Spoke with [facility] Attending Physician/Psycharist (Pucilowska) and will put in Admission Orders.  Attending Physician will be Dr. Bary Leriche.   Patient has been assigned to room 320, by [place]   Intake Paper Work has been signed and placed on patient chart.  ER staff is aware of the admission Lattie Haw, ER Sect.; Dr. Jimmye Norman, ER MD; Gerda Diss, [place] , Patient Access).

## 2016-06-26 NOTE — BHH Suicide Risk Assessment (Signed)
[facility] Admission Suicide Risk Assessment   Nursing information obtained from:    Demographic factors:   Patient is a 61 year old Caucasian male married Current Mental Status:   patient presents with normal speech. His casually groomed in his scrubs. Depressed mood and tearful affect. Some insight and judgment. Denies any perceptive disturbances. 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 MDD (major depressive disorder), recurrent episode, severe ([place]) [F33.2] 06/26/2016   Subjective Data: Patient is a 61 year old male who was involuntarily committed by his family for increasingly aggressive behaviors and depression.  Continued Clinical Symptoms:    The "Alcohol Use Disorders Identification Test", Guidelines for Use in Primary Care, Second Edition.  World Pharmacologist [facility]). Score between 0-7:  no or low risk or alcohol related problems. Score between 8-15:  moderate risk of alcohol related problems. Score between 16-19:  high risk of alcohol related problems. Score 20 or above:  warrants further diagnostic evaluation for alcohol dependence and treatment.   CLINICAL FACTORS:   Depression:   Aggression Hopelessness Impulsivity Insomnia Severe   Musculoskeletal: Strength & Muscle Tone: within normal limits Gait & Station: normal Patient leans: N/A  Psychiatric Specialty Exam: Physical Exam  ROS  Blood pressure (!) 161/80, pulse 86, temperature 98.2 F (36.8 C), temperature source Oral, resp. rate 18, height 5\' 11"  (1.803 m), weight 165 lb (74.8 kg), SpO2 99 %.Body mass index is 23.01 kg/m.   General Appearance: Casual  Eye Contact:  Fair   Speech:  Clear and Coherent  Volume:  Decreased  Mood:  Depressed, Dysphoric, Hopeless and Irritable  Affect:  Depressed and Flat  Thought Process:  Coherent  Orientation:  Full (Time, Place, and Person)  Thought Content:  Logical  Suicidal Thoughts:  Yes.  with intent/plan  Homicidal Thoughts:  No  Memory:  Immediate;   Fair Recent;   Fair Remote;   Fair  Judgement:  Impaired  Insight:  Lacking  Psychomotor Activity:  Decreased  Concentration:  Concentration: Fair and Attention Span: Fair  Recall:  [employer] of Knowledge:  Fair  Language:  Fair  Akathisia:  No  Handed:  Right  AIMS (if indicated):     Assets:  Communication Skills Desire for Improvement Financial Resources/Insurance Housing Social Support  ADL's:  Intact  Cognition:  WNL  Sleep:   ok    Treatment Plan Summary: Daily contact with patient to assess and evaluate symptoms and progress in treatment and Medication management  Observation Level/Precautions:  15 minute checks  Laboratory:  Per admission labs CBC is normal, a 6 metabolic panel is normal except for elevated creatinine at 24, alcohol level is below 5, urine drug screen positive for amphetamines   Psychotherapy:  Patient will receive individual and group therapy to improve his coping skills to deal with his depression and improve communication   Medications:  Will consider starting antidepressant  Consultations:  As needed   Discharge Concerns:  Safety and stabilization   Estimated LOS:To 6 days   Other:     Physician Treatment Plan for Primary Diagnosis: Major depressive disorder recurrent episode severe Long Term Goal(s): Improvement in symptoms  so as ready for discharge  Short Term Goals: Ability to identify changes in lifestyle to reduce recurrence of condition will improve, Ability to verbalize feelings will improve, Ability to disclose and discuss suicidal ideas, Ability to demonstrate self-control will improve, Ability to identify and  develop effective coping behaviors will improve, Ability to maintain clinical measurements within normal limits will improve, Compliance with prescribed medications will improve and Ability to identify triggers associated with substance abuse/mental health issues will improve    COGNITIVE FEATURES THAT CONTRIBUTE TO RISK:  Closed-mindedness    SUICIDE RISK:   Moderate:  Frequent suicidal ideation with limited intensity, and duration, some specificity in terms of plans, no associated intent, good self-control, limited dysphoria/symptomatology, some risk factors present, and identifiable protective factors, including available and accessible social support.  PLAN OF CARE: as above  I certify that inpatient services furnished can reasonably be expected to improve the patient's condition.   Elvin So, MD 06/26/2016, 1:55 PM

## 2016-06-26 NOTE — ED Notes (Signed)

## 2016-06-26 NOTE — BHH Counselor (Signed)
Adult Comprehensive Assessment  Patient ID: Concha Pyo., male   DOB: 11/01/1955, 61 y.o.   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  Marriage going pretty well, per pt. Are you sexually active?: Yes What is your sexual orientation?: hetero Does patient have children?: Yes How many children?: 3 How is patient's relationship with their children?:  3 sons out of the home.  Relationships OK, pt would like to see them more.  Childhood History:  By whom was/is the patient raised?: Both parents Additional childhood history information: Not especially happy.  No major issues. Description of patient's relationship with caregiver when they were a child: "Normal" relationship with parents. Patient's description of current relationship with people who raised him/her: Both parents deceased. How were you disciplined when you got in trouble as a child/adolescent?: Appropriate physical discipline Does patient have siblings?: Yes Number of Siblings: 2 Description of patient's current relationship with siblings: older sister is deceased, younger brother in [place] relationship most of the time Did patient suffer any verbal/emotional/physical/sexual abuse as a child?: No Did  patient suffer from severe childhood neglect?: No Has patient ever been sexually abused/assaulted/raped as an adolescent or adult?: No Was the patient ever a victim of a crime or a disaster?: No Witnessed domestic violence?: No Has patient been effected by domestic violence as an adult?: No  Education:  Highest grade of school patient has completed: BA in forestry Currently a Ship broker?: No Name of school: [employer] [place] Learning disability?: No  Employment/Work Situation:   Employment situation: Employed Where is patient currently employed?: [employer] long has patient been employed?: 27 years Patient's job has been impacted by current illness: No What is the longest time patient has a held a job?: current position Has patient ever been in the [employer]?: No Are There Guns or Other Weapons in [place]?: No (BB guns only)  Pensions consultant:   Financial resources: Income from employment, Income from spouse Does patient have a Programmer, applications or guardian?: No  Alcohol/Substance Abuse:   What has been your use of drugs/alcohol within the last 12 months?: denies all If attempted suicide, did drugs/alcohol play a role in this?: No Alcohol/Substance Abuse Treatment Hx: Denies past history Has alcohol/substance abuse ever caused legal problems?: No  Social Support System:   Pensions consultant Support System: Fair Astronomer System: wife Type of faith/religion: [employer] How does patient's faith help to cope with current illness?: God will use this for my good.  But I haven't got there yet.  Leisure/Recreation:   Leisure and Hobbies: nothing  Strengths/Needs:   What things does the patient do well?: involved helping others, regular routine In what areas does patient struggle / problems for patient: temper, socially isolated  Discharge Plan:   Does patient have access to transportation?: Yes Will patient be returning to same  living situation  after discharge?: Yes Currently receiving community mental health services: No If no, would patient like referral for services when discharged?: Yes (What county?) ([place]) Does patient have financial barriers related to discharge medications?: No  Summary/Recommendations:   Summary and Recommendations (to be completed by the evaluator): Pt is 61 year old male from [place].  Pt diagnosed with major depressive disorder and admitted due to increased anger and concerns about suicide. Recommedations for pt include crisis stabilization, therapeutic milieu, attend and participate in groups, medication management, and development of comprehensive mental wellness plan.  Discharge plan will most likely be step down to outpatient care.  Joanne Chars. 06/26/2016

## 2016-06-26 NOTE — ED Notes (Signed)
Pt does contract for safety and does state he knows he needs some help

## 2016-06-26 NOTE — ED Notes (Signed)
Pt. To BHU from ED ambulatory without difficulty, to room  BHU1. Report from [facility]. Pt. Is alert and oriented, warm and dry in no distress. Pt. Denies SI, HI, and AVH. Pt. Calm and cooperative. Pt. Made aware of security cameras and Q15 minute rounds. Pt. Encouraged to let Nursing staff know of any concerns or needs.

## 2016-06-26 NOTE — Plan of Care (Signed)
Problem: Coping: Goal: Ability to verbalize frustrations and anger appropriately will improve Outcome: Progressing Patient verbalized feelings to staff.    

## 2016-06-26 NOTE — H&P (Signed)
Psychiatric Admission Assessment Adult  Patient Identification: Brett Vance. MRN:  [id number] Date of Evaluation:  06/26/2016 Chief Complaint:  depression Principal Diagnosis: <principal problem not specified> Diagnosis:   Patient Active Problem List   Diagnosis Date Noted  . MDD (major depressive disorder), recurrent episode, severe ([place]) [F33.2] 06/26/2016   History of Present Illness: Patient is a 61 year old male who was involuntarily committed for by his family for having some concerning behaviors in the past few weeks. Per family they noticed a significant change in his behaviors and mood. 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 Per family he had been irritable and easily agitated since then. Per the IVC commitment he had broken down his bedroom door and wife had reported that she was afraid of him. He had been more agitated towards his wife. Because of his behaviors his brother had come to [place] from [place] to be able to talk to him and mediate his current behaviors. He is also been started on Celexa more recently in February and has not been helpful. He was found to be browsing on the Internet on 06/24/2016 and trying to find an artery in his thighs so he could cut it and bleed to death. When his wife found out about this he told her that he would hang himself in the woods. Family reported that the patient can talk his way out of fit and they're worried that he would go home and kill himself if he was let go.  Patient was seen today and he reports being very embarrassed that he was admitted to mental health unit. Patient is pleasant and cooperative. He breaks down into tears occasionally. 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 States that he continues to grieve that. 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 States that he thought he had been  able to deal with it. More recently in early March he developed some cellulitis in his hand and was started on steroids. States that he feels that may have contributed to his mood swings and his extreme temperament. States that he thinks is quite close to his wife and tells her what ever he thinks. He did not thing that would result him in him being admitted here. He does endorse some mild depressed mood and trouble sleeping. Reports he has not had a good appetite. Denies suicidal thoughts. States that he did break some things but he thought it was his muster clearer. He sees a psychiatrist in [place] about 3-4 times a year but awake psychiatry. States he has seen a therapist in the past.  Denies any psychotic symptoms. Denies any homicidal or suicidal thoughts. He denies abuse of alcohol or other drugs. Patient denies any trauma of any type. He reported he reports that the only regret is he has a university degree and has not done anything with it.  Total Time spent with patient: 1 hour  Past Psychiatric History: none  Is the patient at risk to self? Yes.    Has the patient been a risk to self in the past 6 months? No.  Has the patient been a risk to self within the distant past? No.  Is the patient a risk to others? Yes.    Has the patient been a risk to others in the past 6 months? No.  Has the patient been a risk to others within the distant past? No.   Prior Inpatient Therapy:  none Prior Outpatient Therapy:  yes  Alcohol Screening:   Substance Abuse History in the last 12 months:  No. Consequences of Substance Abuse: Negative Previous Psychotropic Medications: Yes  Psychological Evaluations: No  Past Medical History:  Past Medical History:  Diagnosis Date  . Hypertension    No past surgical history on file. Family History: No family history on file. Family Psychiatric  History:  Tobacco Screening:   Social History:  History  Alcohol Use No     History  Drug Use No     Additional Social History:     patient lives at home with his wife. 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 He has 3 grown sons who are all doing well according to patient.          Allergies:  No Known Allergies Lab Results:  Results for orders placed or performed during the hospital encounter of 06/25/16 (from the past 48 hour(s))  Comprehensive metabolic panel     Status: Abnormal   Collection Time: 06/25/16  8:16 PM  Result Value Ref Range   Sodium 137 135 - 145 mmol/L   Potassium 4.2 3.5 - 5.1 mmol/L   Chloride 104 101 - 111 mmol/L   CO2 26 22 - 32 mmol/L   Glucose, Bld 101 (H) 65 - 99 mg/dL   BUN 24 (H) 6 - 20 mg/dL   Creatinine, Ser 0.98 0.61 - 1.24 mg/dL   Calcium 9.3 8.9 - 10.3 mg/dL   Total Protein 6.8 6.5 - 8.1 g/dL   Albumin 3.9 3.5 - 5.0 g/dL   AST 23 15 - 41 U/L   ALT 39 17 - 63 U/L   Alkaline Phosphatase 44 38 - 126 U/L   Total Bilirubin 0.5 0.3 - 1.2 mg/dL   GFR calc non Af Amer >60 >60 mL/min   GFR calc Af Amer >60 >60 mL/min    Comment: (NOTE) The eGFR has been calculated using the CKD EPI equation. This calculation has not been validated in all clinical situations. eGFR's persistently <60 mL/min signify possible Chronic Kidney Disease.    Anion gap 7 5 - 15  Ethanol     Status: None   Collection Time: 06/25/16  8:16 PM  Result Value Ref Range   Alcohol, Ethyl (B) <5 <5 mg/dL    Comment:        LOWEST DETECTABLE LIMIT FOR SERUM ALCOHOL IS 5 mg/dL FOR MEDICAL PURPOSES ONLY   Salicylate level     Status: None   Collection Time: 06/25/16  8:16 PM  Result Value Ref Range   Salicylate Lvl <1.6 2.8 - 30.0 mg/dL  Acetaminophen level     Status: Abnormal   Collection Time: 06/25/16  8:16 PM  Result Value Ref Range   Acetaminophen (Tylenol), Serum <10 (L) 10 - 30 ug/mL    Comment:        THERAPEUTIC CONCENTRATIONS VARY SIGNIFICANTLY. A RANGE OF 10-30 ug/mL MAY BE AN EFFECTIVE CONCENTRATION FOR MANY  PATIENTS. HOWEVER, SOME ARE BEST TREATED AT CONCENTRATIONS OUTSIDE THIS RANGE. ACETAMINOPHEN CONCENTRATIONS >150 ug/mL AT 4 HOURS AFTER INGESTION AND >50 ug/mL AT 12 HOURS AFTER INGESTION ARE OFTEN ASSOCIATED WITH TOXIC REACTIONS.   cbc     Status: Abnormal   Collection Time: 06/25/16  8:16 PM  Result Value Ref Range   WBC 9.6 3.8 - 10.6  K/uL   RBC 4.65 4.40 - 5.90 MIL/uL   Hemoglobin 13.6 13.0 - 18.0 g/dL   HCT 40.4 40.0 - 52.0 %   MCV 86.9 80.0 - 100.0 fL   MCH 29.2 26.0 - 34.0 pg   MCHC 33.6 32.0 - 36.0 g/dL   RDW 15.4 (H) 11.5 - 14.5 %   Platelets 210 150 - 440 K/uL  Urine Drug Screen, Qualitative     Status: Abnormal   Collection Time: 06/25/16  8:18 PM  Result Value Ref Range   Tricyclic, Ur Screen NONE DETECTED NONE DETECTED   Amphetamines, Ur Screen POSITIVE (A) NONE DETECTED   MDMA (Ecstasy)Ur Screen NONE DETECTED NONE DETECTED   Cocaine Metabolite,Ur [place] NONE DETECTED NONE DETECTED   Opiate, Ur Screen NONE DETECTED NONE DETECTED   Phencyclidine (PCP) Ur S NONE DETECTED NONE DETECTED   Cannabinoid 50 Ng, Ur [place] NONE DETECTED NONE DETECTED   Barbiturates, Ur Screen NONE DETECTED NONE DETECTED   Benzodiazepine, Ur Scrn NONE DETECTED NONE DETECTED   Methadone Scn, Ur NONE DETECTED NONE DETECTED    Comment: (NOTE) 161  Tricyclics, urine               Cutoff 1000 ng/mL 200  Amphetamines, urine             Cutoff 1000 ng/mL 300  MDMA (Ecstasy), urine           Cutoff 500 ng/mL 400  Cocaine Metabolite, urine       Cutoff 300 ng/mL 500  Opiate, urine                   Cutoff 300 ng/mL 600  Phencyclidine (PCP), urine      Cutoff 25 ng/mL 700  Cannabinoid, urine              Cutoff 50 ng/mL 800  Barbiturates, urine             Cutoff 200 ng/mL 900  Benzodiazepine, urine           Cutoff 200 ng/mL 1000 Methadone, urine                Cutoff 300 ng/mL 1100 1200 The urine drug screen provides only a preliminary, unconfirmed 1300 analytical test result and should not be used  for non-medical 1400 purposes. Clinical consideration and professional judgment should 1500 be applied to any positive drug screen result due to possible 1600 interfering substances. A more specific alternate chemical method 1700 must be used in order to obtain a confirmed analytical result.  1800 Gas chromato graphy / mass spectrometry (GC/MS) is the preferred 1900 confirmatory method.     Blood Alcohol level:  Lab Results  Component Value Date   ETH <5 09/60/4540    Metabolic Disorder Labs:  No results found for: HGBA1C, MPG No results found for: PROLACTIN No results found for: CHOL, TRIG, HDL, CHOLHDL, VLDL, LDLCALC  Current Medications: Current Facility-Administered Medications  Medication Dose Route Frequency Provider Last Rate Last Dose  . acetaminophen (TYLENOL) tablet 650 mg  650 mg Oral Q6H PRN Kyrie Fludd, MD      . alum & mag hydroxide-simeth (MAALOX/MYLANTA) 200-200-20 MG/5ML suspension 30 mL  30 mL Oral Q4H PRN Domenik Trice, MD      . irbesartan (AVAPRO) tablet 300 mg  300 mg Oral Daily Nevin Grizzle, MD      . magnesium hydroxide (MILK OF MAGNESIA) suspension 30 mL  30 mL Oral Daily PRN Elvin So, MD      .  sertraline (ZOLOFT) tablet 25 mg  25 mg Oral Daily Tycho Cheramie, MD      . traZODone (DESYREL) tablet 50 mg  50 mg Oral QHS PRN Aarit Kashuba, MD       PTA Medications: Prescriptions Prior to Admission  Medication Sig Dispense Refill Last Dose  . HYDROcodone-acetaminophen (NORCO) 5-325 MG tablet Take 1-2 tablets by mouth every 4 (four) hours as needed for moderate pain. 15 tablet 0   . sulfacetamide (BLEPH-10) 10 % ophthalmic solution Place 2 drops into the right eye 4 (four) times daily. 5 mL 0   . valsartan (DIOVAN) 160 MG tablet Take 160 mg by mouth 2 (two) times daily.       Musculoskeletal: Strength & Muscle Tone: within normal limits Gait & Station: normal Patient leans: N/A  Psychiatric Specialty Exam: Physical Exam  ROS  Blood  pressure (!) 161/80, pulse 86, temperature 98.2 F (36.8 C), temperature source Oral, resp. rate 18, height _0  (1.803 m), weight 165 lb (74.8 kg), SpO2 99 %.Body mass index is 23.01 kg/m.  General Appearance: Casual  Eye Contact:  Fair  Speech:  Clear and Coherent  Volume:  Decreased  Mood:  Depressed, Dysphoric, Hopeless and Irritable  Affect:  Depressed and Flat  Thought Process:  Coherent  Orientation:  Full (Time, Place, and Person)  Thought Content:  Logical  Suicidal Thoughts:  Yes.  with intent/plan  Homicidal Thoughts:  No  Memory:  Immediate;   Fair Recent;   Fair Remote;   Fair  Judgement:  Impaired  Insight:  Lacking  Psychomotor Activity:  Decreased  Concentration:  Concentration: Fair and Attention Span: Fair  Recall:  [employer] of Knowledge:  Fair  Language:  Fair  Akathisia:  No  Handed:  Right  AIMS (if indicated):     Assets:  Communication Skills Desire for Improvement Financial Resources/Insurance Housing Social Support  ADL's:  Intact  Cognition:  WNL  Sleep:   ok    Treatment Plan Summary: Daily contact with patient to assess and evaluate symptoms and progress in treatment and Medication management  Observation Level/Precautions:  15 minute checks  Laboratory:  Per admission labs CBC is normal, a 6 metabolic panel is normal except for elevated creatinine at 24, alcohol level is below 5, urine drug screen positive for amphetamines   Psychotherapy:  Patient will receive individual and group therapy to improve his coping skills to deal with his depression and improve communication   Medications:  Will consider starting antidepressant   Consultations:  As needed   Discharge Concerns:  Safety and stabilization   Estimated LOS:To 6 days   Other:     Hypertension: Start Valsartan at 160 mg twice daily or use substitute if not available at pharmacy  Physician Treatment Plan for Primary Diagnosis: Major depressive disorder recurrent episode  severe Long Term Goal(s): Improvement in symptoms so as ready for discharge   Medications: Start Zoloft at 25 mg once daily  Short Term Goals: Ability to identify changes in lifestyle to reduce recurrence of condition will improve, Ability to verbalize feelings will improve, Ability to disclose and discuss suicidal ideas, Ability to demonstrate self-control will improve, Ability to identify and develop effective coping behaviors will improve, Ability to maintain clinical measurements within normal limits will improve, Compliance with prescribed medications will improve and Ability to identify triggers associated with substance abuse/mental health issues will improve   I certify that inpatient services furnished can reasonably be expected to improve the patient's  condition.    Elvin So, MD 3/27/20182:05 PM

## 2016-06-26 NOTE — Tx Team (Signed)
Initial Treatment Plan 06/26/2016 2:59 PM [place]. OTR:[id number]    PATIENT STRESSORS: Financial difficulties Marital or family conflict Medication change or noncompliance   PATIENT STRENGTHS: Average or above average intelligence Capable of independent living Communication skills Supportive family/friends   PATIENT IDENTIFIED PROBLEMS: Depression 06/26/2016  Suicidal ideations 06/26/2016.                   DISCHARGE CRITERIA:  Ability to meet basic life and health needs Adequate post-discharge living arrangements Improved stabilization in mood, thinking, and/or behavior  PRELIMINARY DISCHARGE PLAN: Attend aftercare/continuing care group Return to previous living arrangement Return to previous work or school arrangements  PATIENT/FAMILY INVOLVEMENT: This treatment plan has been presented to and reviewed with the patient, Brett Vance., and/or family member,   The patient and family have been given the opportunity to ask questions and make suggestions.  Merlene Morse, RN 06/26/2016, 2:59 PM

## 2016-06-26 NOTE — Progress Notes (Signed)
Patient pleasant and cooperative during admission assessment. Patient denies SI/HI at this time. Patient denies AVH. Patient informed of fall risk status, fall risk assessed "low" at this time. Patient oriented to unit/staff/room. Patient denies any questions/concerns at this time. Patient safe on unit with Q15 minute checks for safety. Skin assessment & body search done.No contraband done.

## 2016-06-26 NOTE — BHH Group Notes (Signed)
[facility] [place] Group Notes:  (Nursing/MHT/Case Management/Adjunct)  Date:  06/26/2016  Time:  2:28 PM  Type of Therapy:  Psychoeducational Skills  Participation Level:  None  Participation Quality:  Appropriate and Attentive  Affect:  Appropriate  Cognitive:  Appropriate  Insight:  Appropriate  Engagement in Group:  Engaged  Modes of Intervention:  Discussion and Education  Summary of Progress/Problems:  Charise Killian 06/26/2016, 2:28 PM

## 2016-06-26 NOTE — ED Provider Notes (Signed)
-----------------------------------------   5:32 AM on 06/26/2016 -----------------------------------------   Blood pressure (!) 157/90, pulse 68, temperature 97.8 F (36.6 C), temperature source Oral, resp. rate 18, height 5\' 9"  (1.753 m), weight 175 lb (79.4 kg), SpO2 98 %.  The patient had no acute events since last update.  Calm and cooperative at this time.  Disposition is pending Psychiatry/Behavioral Medicine team recommendations.     Nena Polio, MD 06/26/16 [phone]

## 2016-06-26 NOTE — ED Notes (Signed)
ED BHU [place] Is the patient under IVC or is there intent for IVC: Yes.   Is the patient medically cleared: Yes.   Is there vacancy in the ED BHU: Yes.   Is the population mix appropriate for patient: Yes.   Is the patient awaiting placement in inpatient or outpatient setting: No. Has the patient had a psychiatric consult: No. Survey of unit performed for contraband, proper placement and condition of furniture, tampering with fixtures in bathroom, shower, and each patient room: Yes.  ; Findings: NA APPEARANCE/BEHAVIOR calm, cooperative and adequate rapport can be established NEURO ASSESSMENT Orientation: time, place and person Hallucinations: No.None noted (Hallucinations) Speech: Normal Gait: normal RESPIRATORY ASSESSMENT Normal expansion.  Clear to auscultation.  No rales, rhonchi, or wheezing. CARDIOVASCULAR ASSESSMENT regular rate and rhythm, S1, S2 normal, no murmur, click, rub or gallop GASTROINTESTINAL ASSESSMENT soft, nontender, BS WNL, no r/g EXTREMITIES normal strength, tone, and muscle mass PLAN OF CARE Provide calm/safe environment. Vital signs assessed twice daily. ED BHU Assessment once each 12-hour shift. Collaborate with intake RN daily or as condition indicates. Assure the ED provider has rounded once each shift. Provide and encourage hygiene. Provide redirection as needed. Assess for escalating behavior; address immediately and inform ED provider.  Assess family dynamic and appropriateness for visitation as needed: Yes.  ; If necessary, describe findings: NA Educate the patient/family about BHU procedures/visitation: Yes.  ; If necessary, describe findings: NA

## 2016-06-27 MED ORDER — TAMSULOSIN HCL 0.4 MG PO CAPS
0.4000 mg | ORAL_CAPSULE | Freq: Every day | ORAL | Status: DC
  Administered 2016-06-27 – 2016-06-28 (×2): 0.4 mg via ORAL
  Filled 2016-06-27 (×3): qty 1

## 2016-06-27 NOTE — BHH Group Notes (Signed)
[place] LCSW Group Therapy   06/27/2016  9:30 am   Type of Therapy: Group Therapy   Participation Level: Active   Participation Quality: Attentive, Sharing and Supportive   Affect: Appropriate  Cognitive: Alert and Oriented   Insight: Developing/Improving and Engaged   Engagement in Therapy: Developing/Improving and Engaged   Modes of Intervention: Clarification, Confrontation, Discussion, Education, Exploration, Limit-setting, Orientation, Problem-solving, Rapport Building, Art therapist, Socialization and Support   Summary of Progress/Problems: The topic for group today was emotional regulation. This group focused on both positive and negative emotion identification and allowed  group members to process ways to identify feelings, regulate negative emotions, and find healthy ways to manage internal/external emotions. Group members were asked to reflect on a time when their reaction to an emotion led to a negative outcome and explored how alternative responses using emotion regulation would have benefited them. Group members were also asked to discuss a time when emotion regulation was utilized when a negative emotion was experienced. Pt defined emotion regulation as "processing your thoughts before reacting." He stated that his negative emotion was feelings of betrayal and disrespect. His reactions were throwing things and being argumentative. Pt identified healthy coping mechanisms to implement to regulate those negative emotions.    Glorious Peach, MSW, [place] 06/27/2016, 1:29PM

## 2016-06-27 NOTE — Progress Notes (Signed)
[facility] MD Progress Note  06/27/2016 9:50 AM [place].  MRN:  [id number] Subjective: Patient is a 61 year old Caucasian male who was involuntarily committed by his wife for aggressive behaviors and suicidal thoughts.   Patient was seen this morning for follow-up. He reports that he slept well and had a good breakfast. States that he did start taking the Zoloft and tolerated well. Reports he is feeling slightly better this morning. Reports that he did speak to his wife last night. States the conversation went well and he reports that she his wife thinks that the everything is going to get better because of this hospitalization. Continues to harbor his concerns about his job since his being hospitalized and paying his bills. Denies any suicidal thoughts this morning.  Principal Problem: <principal problem not specified> Diagnosis:   Patient Active Problem List   Diagnosis Date Noted  . MDD (major depressive disorder), recurrent episode, severe ([place]) [F33.2] 06/26/2016   Total Time spent with patient: 20 minutes  Past Psychiatric History: Was seeing a psychiatrist for depression. No previous psychiatric hospitalizations or suicide attempts.  Past Medical History:  Past Medical History:  Diagnosis Date  . Hypertension    History reviewed. No pertinent surgical history. Family History: History reviewed. No pertinent family history. Family Psychiatric  History: mother had depression. Social History:  History  Alcohol Use No     History  Drug Use No    Social History   Social History  . Marital status: Married    Spouse name: N/A  . Number of children: N/A  . Years of education: N/A   Social History Main Topics  . Smoking status: Never Smoker  . Smokeless tobacco: Never Used  . Alcohol use No  . Drug use: No  . Sexual activity: Not Asked   Other Topics Concern  . None   Social History Narrative  . None   Additional Social History:              Sleep:  Fair  Appetite:  Fair  Current Medications: Current Facility-Administered Medications  Medication Dose Route Frequency Provider Last Rate Last Dose  . acetaminophen (TYLENOL) tablet 650 mg  650 mg Oral Q6H PRN Mairlyn Tegtmeyer, MD      . alum & mag hydroxide-simeth (MAALOX/MYLANTA) 200-200-20 MG/5ML suspension 30 mL  30 mL Oral Q4H PRN Adael Culbreath, MD      . irbesartan (AVAPRO) tablet 300 mg  300 mg Oral Daily Adrian Specht, MD   300 mg at 06/27/16 0810  . magnesium hydroxide (MILK OF MAGNESIA) suspension 30 mL  30 mL Oral Daily PRN Crisol Muecke, MD      . sertraline (ZOLOFT) tablet 25 mg  25 mg Oral Daily Chellsea Beckers, MD   25 mg at 06/27/16 0810  . traZODone (DESYREL) tablet 50 mg  50 mg Oral QHS PRN Sayla Golonka, MD   50 mg at 06/26/16 2241    Lab Results:  Results for orders placed or performed during the hospital encounter of 06/25/16 (from the past 48 hour(s))  Comprehensive metabolic panel     Status: Abnormal   Collection Time: 06/25/16  8:16 PM  Result Value Ref Range   Sodium 137 135 - 145 mmol/L   Potassium 4.2 3.5 - 5.1 mmol/L   Chloride 104 101 - 111 mmol/L   CO2 26 22 - 32 mmol/L   Glucose, Bld 101 (H) 65 - 99 mg/dL   BUN 24 (H) 6 - 20 mg/dL  Creatinine, Ser 0.98 0.61 - 1.24 mg/dL   Calcium 9.3 8.9 - 10.3 mg/dL   Total Protein 6.8 6.5 - 8.1 g/dL   Albumin 3.9 3.5 - 5.0 g/dL   AST 23 15 - 41 U/L   ALT 39 17 - 63 U/L   Alkaline Phosphatase 44 38 - 126 U/L   Total Bilirubin 0.5 0.3 - 1.2 mg/dL   GFR calc non Af Amer >60 >60 mL/min   GFR calc Af Amer >60 >60 mL/min    Comment: (NOTE) The eGFR has been calculated using the CKD EPI equation. This calculation has not been validated in all clinical situations. eGFR's persistently <60 mL/min signify possible Chronic Kidney Disease.    Anion gap 7 5 - 15  Ethanol     Status: None   Collection Time: 06/25/16  8:16 PM  Result Value Ref Range   Alcohol, Ethyl (B) <5 <5 mg/dL    Comment:        LOWEST  DETECTABLE LIMIT FOR SERUM ALCOHOL IS 5 mg/dL FOR MEDICAL PURPOSES ONLY   Salicylate level     Status: None   Collection Time: 06/25/16  8:16 PM  Result Value Ref Range   Salicylate Lvl <0.6 2.8 - 30.0 mg/dL  Acetaminophen level     Status: Abnormal   Collection Time: 06/25/16  8:16 PM  Result Value Ref Range   Acetaminophen (Tylenol), Serum <10 (L) 10 - 30 ug/mL    Comment:        THERAPEUTIC CONCENTRATIONS VARY SIGNIFICANTLY. A RANGE OF 10-30 ug/mL MAY BE AN EFFECTIVE CONCENTRATION FOR MANY PATIENTS. HOWEVER, SOME ARE BEST TREATED AT CONCENTRATIONS OUTSIDE THIS RANGE. ACETAMINOPHEN CONCENTRATIONS >150 ug/mL AT 4 HOURS AFTER INGESTION AND >50 ug/mL AT 12 HOURS AFTER INGESTION ARE OFTEN ASSOCIATED WITH TOXIC REACTIONS.   cbc     Status: Abnormal   Collection Time: 06/25/16  8:16 PM  Result Value Ref Range   WBC 9.6 3.8 - 10.6 K/uL   RBC 4.65 4.40 - 5.90 MIL/uL   Hemoglobin 13.6 13.0 - 18.0 g/dL   HCT 40.4 40.0 - 52.0 %   MCV 86.9 80.0 - 100.0 fL   MCH 29.2 26.0 - 34.0 pg   MCHC 33.6 32.0 - 36.0 g/dL   RDW 15.4 (H) 11.5 - 14.5 %   Platelets 210 150 - 440 K/uL  Urine Drug Screen, Qualitative     Status: Abnormal   Collection Time: 06/25/16  8:18 PM  Result Value Ref Range   Tricyclic, Ur Screen NONE DETECTED NONE DETECTED   Amphetamines, Ur Screen POSITIVE (A) NONE DETECTED   MDMA (Ecstasy)Ur Screen NONE DETECTED NONE DETECTED   Cocaine Metabolite,Ur [place] NONE DETECTED NONE DETECTED   Opiate, Ur Screen NONE DETECTED NONE DETECTED   Phencyclidine (PCP) Ur S NONE DETECTED NONE DETECTED   Cannabinoid 50 Ng, Ur  NONE DETECTED NONE DETECTED   Barbiturates, Ur Screen NONE DETECTED NONE DETECTED   Benzodiazepine, Ur Scrn NONE DETECTED NONE DETECTED   Methadone Scn, Ur NONE DETECTED NONE DETECTED    Comment: (NOTE) 237  Tricyclics, urine               Cutoff 1000 ng/mL 200  Amphetamines, urine             Cutoff 1000 ng/mL 300  MDMA (Ecstasy), urine           Cutoff 500  ng/mL 400  Cocaine Metabolite, urine       Cutoff 300 ng/mL 500  Opiate, urine                   Cutoff 300 ng/mL 600  Phencyclidine (PCP), urine      Cutoff 25 ng/mL 700  Cannabinoid, urine              Cutoff 50 ng/mL 800  Barbiturates, urine             Cutoff 200 ng/mL 900  Benzodiazepine, urine           Cutoff 200 ng/mL 1000 Methadone, urine                Cutoff 300 ng/mL 1100 1200 The urine drug screen provides only a preliminary, unconfirmed 1300 analytical test result and should not be used for non-medical 1400 purposes. Clinical consideration and professional judgment should 1500 be applied to any positive drug screen result due to possible 1600 interfering substances. A more specific alternate chemical method 1700 must be used in order to obtain a confirmed analytical result.  1800 Gas chromato graphy / mass spectrometry (GC/MS) is the preferred 1900 confirmatory method.     Blood Alcohol level:  Lab Results  Component Value Date   ETH <5 30/12/2328    Metabolic Disorder Labs: No results found for: HGBA1C, MPG No results found for: PROLACTIN No results found for: CHOL, TRIG, HDL, CHOLHDL, VLDL, LDLCALC  Physical Findings: AIMS:  , ,  ,  ,    CIWA:    COWS:     Musculoskeletal: Strength & Muscle Tone: within normal limits Gait & Station: normal Patient leans: N/A  Psychiatric Specialty Exam: Physical Exam  ROS  Blood pressure 134/76, pulse 85, temperature 98.5 F (36.9 C), temperature source Oral, resp. rate 18, height _0  (1.803 m), weight 165 lb (74.8 kg), SpO2 99 %.Body mass index is 23.01 kg/m.  General Appearance: Casual  Eye Contact:  Fair  Speech:  Clear and Coherent  Volume:  Decreased  Mood:  Anxious and Dysphoric  Affect:  Appropriate  Thought Process:  Coherent  Orientation:  Full (Time, Place, and Person)  Thought Content:  Logical  Suicidal Thoughts:  No  Homicidal Thoughts:  No  Memory:  Immediate;   Fair Recent;   Fair Remote;    Fair  Judgement:  Fair  Insight:  Shallow  Psychomotor Activity:  Normal  Concentration:  Concentration: Fair and Attention Span: Fair  Recall:  [employer] of Knowledge:  Fair  Language:  Fair  Akathisia:  No  Handed:  Right  AIMS (if indicated):     Assets:  Communication Skills Desire for Improvement Financial Resources/Insurance Housing Physical Health Resilience Social Support Vocational/Educational  ADL's:  Intact  Cognition:  WNL  Sleep:  Number of Hours: 7     Treatment Plan Summary: Daily contact with patient to assess and evaluate symptoms and progress in treatment and Medication management   Patient is a 61 year old Caucasian male with the long history of depression who developed some aggressive behaviors and suicidal thoughts after he was given a course of steroids for a swollen hand and in the context of some other psychosocial stressors.  Major depressive disorder  Patient will be observed on a every 15 minute check. Continue Zoloft at 25 mg. We will consider increasing to 50 mg tomorrow. Patient to attend groups and part spitting in individual therapy to improve his coping skills.  Insomnia Continue trazodone at 50 mg at bedtime  Labs- CBC was normal at admission and  normal metabolic panel, urine drug screen was positive for amphetamines but patient reported he was taking Adderall through his psychiatrist  Hypertension Patient was taking valsartan, substituted with Irbesartan 300 mg in the hospital.  Prostatic hypertrophy Patient reports taking Flomax and will start him at the low dose of 0.4 mg daily in the evening  Disposition Discussed with patient that he could benefit from intensive outpatient therapy or partial hospitalization program once his discharge from here. Patient concerned about his job and states she does not have any more leave left. We will continue this discussion towards patient's discharge. He is willing to see a therapist and  continue to follow-up with a psychiatrist.  Elvin So, MD 06/27/2016, 9:50 AM

## 2016-06-27 NOTE — Plan of Care (Signed)
Problem: Coping: Goal: Ability to verbalize feelings will improve Outcome: Progressing Pt is open with thoughts and feelings when approach. Quiet with minimal interactions otherwise.

## 2016-06-27 NOTE — BHH Group Notes (Signed)
[place] Group Notes:  (Nursing/MHT/Case Management/Adjunct)  Date:  06/27/2016  Time:  1:05 AM  Type of Therapy:  Wrap-up Group  Participation Level:  Active  Participation Quality:  Appropriate and Attentive  Affect:  Appropriate  Cognitive:  Alert and Appropriate  Insight:  Appropriate, Good and Improving  Engagement in Group:  Developing/Improving and Engaged  Modes of Intervention:  Discussion  Summary of Progress/Problems:  Levonne Spiller 06/27/2016, 1:05 AM

## 2016-06-27 NOTE — BHH Suicide Risk Assessment (Signed)
[facility] INPATIENT:  Family/Significant Other Suicide Prevention Education  Suicide Prevention Education:  Education Completed; Brett Vance, wife, [phone], has been identified by the patient as the family member/significant other with whom the patient will be residing, and identified as the person(s) who will aid the patient in the event of a mental health crisis (suicidal ideations/suicide attempt).  With written consent from the patient, the family member/significant other has been provided the following suicide prevention education, prior to the and/or following the discharge of the patient.  The suicide prevention education provided includes the following:  Suicide risk factors  Suicide prevention and interventions  National Suicide Hotline telephone number  [facility] assessment telephone number  [facility] Emergency Assistance [place] and/or Residential Mobile Crisis Unit telephone number  Request made of family/significant other to:  Remove weapons (e.g., guns, rifles, knives), all items previously/currently identified as safety concern.  There is a 22 rifle and several BB guns that she agreed to remove from the home.  Remove drugs/medications (over-the-counter, prescriptions, illicit drugs), all items previously/currently identified as a safety concern.  The family member/significant other verbalizes understanding of the suicide prevention education information provided.  The family member/significant other agrees to remove the items of safety concern listed above.  Brett Vance also reported the following:  Pt has had anger issues long term but much worse lately, particularly after he was put on prednisone after a hand injury swelled up.  He has had "violent outbursts" including smashing potted plants, throwing a soda can threw a window, and punching holes in a wall several times.  He made minor cuts on his leg with the broken pot which made Brett Vance worry  that he was "thinking about" suicide.  He also has had long term issues sometimes with not sleeping a few days in a row--works second shift and comes home often at midnight.  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  He had an outburst in Sunday school class last weekend and the church has talked about not allowing him to return.  Brett Vance is planning to stay with her children for a little while because she does not feel safe right now. She does not plan for this to be permanent, but she wants him to know this is serious.  Joanne Chars, LCSW 06/27/2016, 9:05 AM

## 2016-06-27 NOTE — Tx Team (Signed)
Interdisciplinary Treatment and Diagnostic Plan Update  06/27/2016 Time of Session: [place]. MRN: [id number]  Principal Diagnosis: <principal problem not specified>  Secondary Diagnoses: Active Problems:   MDD (major depressive disorder), recurrent episode, severe ([facility])   Current Medications:  Current Facility-Administered Medications  Medication Dose Route Frequency Provider Last Rate Last Dose  . acetaminophen (TYLENOL) tablet 650 mg  650 mg Oral Q6H PRN Himabindu Ravi, MD      . alum & mag hydroxide-simeth (MAALOX/MYLANTA) 200-200-20 MG/5ML suspension 30 mL  30 mL Oral Q4H PRN Himabindu Ravi, MD      . irbesartan (AVAPRO) tablet 300 mg  300 mg Oral Daily Himabindu Ravi, MD   300 mg at 06/27/16 0810  . magnesium hydroxide (MILK OF MAGNESIA) suspension 30 mL  30 mL Oral Daily PRN Himabindu Ravi, MD      . sertraline (ZOLOFT) tablet 25 mg  25 mg Oral Daily Himabindu Ravi, MD   25 mg at 06/27/16 0810  . tamsulosin (FLOMAX) capsule 0.4 mg  0.4 mg Oral QPC supper Himabindu Ravi, MD      . traZODone (DESYREL) tablet 50 mg  50 mg Oral QHS PRN Himabindu Ravi, MD   50 mg at 06/26/16 2241   PTA Medications: Prescriptions Prior to Admission  Medication Sig Dispense Refill Last Dose  . HYDROcodone-acetaminophen (NORCO) 5-325 MG tablet Take 1-2 tablets by mouth every 4 (four) hours as needed for moderate pain. 15 tablet 0   . sulfacetamide (BLEPH-10) 10 % ophthalmic solution Place 2 drops into the right eye 4 (four) times daily. 5 mL 0   . valsartan (DIOVAN) 160 MG tablet Take 160 mg by mouth 2 (two) times daily.       Patient Stressors: Financial difficulties Marital or family conflict Medication change or noncompliance  Patient Strengths: Average or above average intelligence Capable of independent living Communication skills Supportive family/friends  Treatment Modalities: Medication Management, Group therapy, Case management,  1 to 1 session with clinician,  Psychoeducation, Recreational therapy.   Physician Treatment Plan for Primary Diagnosis: <principal problem not specified> Long Term Goal(s): Improvement in symptoms so as ready for discharge   Short Term Goals: Ability to identify changes in lifestyle to reduce recurrence of condition will improve Ability to verbalize feelings will improve Ability to disclose and discuss suicidal ideas Ability to demonstrate self-control will improve Ability to identify and develop effective coping behaviors will improve Ability to maintain clinical measurements within normal limits will improve Compliance with prescribed medications will improve Ability to identify triggers associated with substance abuse/mental health issues will improve  Medication Management: Evaluate patient's response, side effects, and tolerance of medication regimen.  Therapeutic Interventions: 1 to 1 sessions, Unit Group sessions and Medication administration.  Evaluation of Outcomes: Progressing  Physician Treatment Plan for Secondary Diagnosis: Active Problems:   MDD (major depressive disorder), recurrent episode, severe ([place])  Long Term Goal(s): Improvement in symptoms so as ready for discharge   Short Term Goals: Ability to identify changes in lifestyle to reduce recurrence of condition will improve Ability to verbalize feelings will improve Ability to disclose and discuss suicidal ideas Ability to demonstrate self-control will improve Ability to identify and develop effective coping behaviors will improve Ability to maintain clinical measurements within normal limits will improve Compliance with prescribed medications will improve Ability to identify triggers associated with substance abuse/mental health issues will improve     Medication Management: Evaluate patient's response, side effects, and tolerance of medication regimen.  Therapeutic Interventions:  1 to 1 sessions, Unit Group sessions and Medication  administration.  Evaluation of Outcomes: Progressing   RN Treatment Plan for Primary Diagnosis: <principal problem not specified> Long Term Goal(s): Knowledge of disease and therapeutic regimen to maintain health will improve  Short Term Goals: Ability to demonstrate self-control, Ability to identify and develop effective coping behaviors will improve and Compliance with prescribed medications will improve  Medication Management: RN will administer medications as ordered by provider, will assess and evaluate patient's response and provide education to patient for prescribed medication. RN will report any adverse and/or side effects to prescribing provider.  Therapeutic Interventions: 1 on 1 counseling sessions, Psychoeducation, Medication administration, Evaluate responses to treatment, Monitor vital signs and CBGs as ordered, Perform/monitor CIWA, COWS, AIMS and Fall Risk screenings as ordered, Perform wound care treatments as ordered.  Evaluation of Outcomes: Progressing   LCSW Treatment Plan for Primary Diagnosis: <principal problem not specified> Long Term Goal(s): Safe transition to appropriate next level of care at discharge, Engage patient in therapeutic group addressing interpersonal concerns.  Short Term Goals: Engage patient in aftercare planning with referrals and resources, Increase ability to appropriately verbalize feelings, Increase emotional regulation and Increase skills for wellness and recovery  Therapeutic Interventions: Assess for all discharge needs, 1 to 1 time with Social worker, Explore available resources and support systems, Assess for adequacy in community support network, Educate family and significant other(s) on suicide prevention, Complete Psychosocial Assessment, Interpersonal group therapy.  Evaluation of Outcomes: Progressing   Progress in Treatment: Attending groups: Yes. Participating in groups: Yes. Taking medication as prescribed: Yes. Toleration  medication: Yes. Family/Significant other contact made: Yes, individual(s) contacted:  wife Patient understands diagnosis: Yes. Discussing patient identified problems/goals with staff: Yes. Medical problems stabilized or resolved: Yes. Denies suicidal/homicidal ideation: Yes. Issues/concerns per patient self-inventory: No. Other: none  New problem(s) identified: No, Describe:  none  New Short Term/Long Term Goal(s):Pt reports his goal is to discharge quickly but also to work on emotional regulation.  Discharge Plan or Barriers: CSW assessing for appropriate plan.  Reason for Continuation of Hospitalization: Aggression Medication stabilization  Estimated Length of Stay: 3-5 days.  Attendees: Patient: Brett Vance [facility] 06/27/2016   Physician: Dr. Einar Grad, MD 06/27/2016   Nursing: Polly Cobia, RN 06/27/2016   RN Care Manager:    Social Worker: Lurline Idol, LCSW 06/27/2016   Recreational Therapist:    Other:    Other:    Other:         Scribe for Treatment Team: Joanne Chars, LCSW 06/27/2016 1:49 PM

## 2016-06-27 NOTE — Progress Notes (Signed)
D: Pt denies SI/HI/AVH. Pt is pleasant and cooperative, affect is flat and sad, but brightens upon approach. Patient  thoughts are organized he appears less anxious and he is interacting with peers and staff appropriately.  A: Pt was offered support and encouragement. Pt was given scheduled medications. Pt was encouraged to attend groups. Q 15 minute checks were done for safety.  R:Pt attends groups and interacts well with peers and staff. Pt is taking medication. Pt has no complaints.Pt receptive to treatment and safety maintained on unit.

## 2016-06-27 NOTE — BHH Group Notes (Signed)
[place] Group Notes:  (Nursing/MHT/Case Management/Adjunct)  Date:  06/27/2016  Time:  5:50 PM  Type of Therapy:  Psychoeducational Skills  Participation Level:  Did Not Attend    Drake Leach 06/27/2016, 5:50 PM

## 2016-06-27 NOTE — Progress Notes (Signed)
Pt awake, alert, oriented and up on unit today. Appropriately but minimally interacts with staff/peers. Attended groups today. Denies SI/HI/AVH. Forwards little to this nurse, is very quiet. Showered today. Calm and cooperative. Medication compliant. Reports good appetite and sleep. No complaints voiced. Support and encouragement provided. Medications administered as ordered with education, pt is knowledgeable of meds. Safety maintained with every 15 minute checks. Will continue to monitor.

## 2016-06-28 MED ORDER — PANTOPRAZOLE SODIUM 40 MG PO TBEC
40.0000 mg | DELAYED_RELEASE_TABLET | Freq: Every day | ORAL | Status: DC
  Administered 2016-06-28: 40 mg via ORAL
  Filled 2016-06-28: qty 1

## 2016-06-28 MED ORDER — SERTRALINE HCL 25 MG PO TABS
50.0000 mg | ORAL_TABLET | Freq: Every day | ORAL | Status: DC
  Administered 2016-06-29: 50 mg via ORAL
  Filled 2016-06-28: qty 2

## 2016-06-28 NOTE — Progress Notes (Signed)
[facility] MD Progress Note  06/28/2016 9:44 AM [place].  MRN:  [id number] Subjective: Patient is a 61 year old Caucasian male who was involuntarily committed by his wife for aggressive behaviors and suicidal thoughts.   Patient was seen this morning for follow-up. Patient is lying in his bed and appears quite her. States that he is doing well and his wife has visited him every day for the past 2 days. He is not elaborating on their conversations but states it went well. Reports fair sleep and appetite. States his beginning to feel better. But not talking about anything else. He is more interested in discharge. Denies suicidal thoughts Has not been disruptive on the unit. Has been cooperative and participating in groups. Per social worker patient's wife has told him that that she will be leaving the home. States it may not be a permanent ago but states that she had enough with his anger issues. We will discuss this with patient tomorrow   Principal Problem: <principal problem not specified> Diagnosis:   Patient Active Problem List   Diagnosis Date Noted  . MDD (major depressive disorder), recurrent episode, severe ([place]) [F33.2] 06/26/2016   Total Time spent with patient: 20 minutes  Past Psychiatric History: Was seeing a psychiatrist for depression. No previous psychiatric hospitalizations or suicide attempts.  Past Medical History:  Past Medical History:  Diagnosis Date  . Hypertension    History reviewed. No pertinent surgical history. Family History: History reviewed. No pertinent family history. Family Psychiatric  History: mother had depression. Social History:  History  Alcohol Use No     History  Drug Use No    Social History   Social History  . Marital status: Married    Spouse name: N/A  . Number of children: N/A  . Years of education: N/A   Social History Main Topics  . Smoking status: Never Smoker  . Smokeless tobacco: Never Used  . Alcohol use No  . Drug  use: No  . Sexual activity: Not Asked   Other Topics Concern  . None   Social History Narrative  . None   Additional Social History:              Sleep: Fair  Appetite:  Fair  Current Medications: Current Facility-Administered Medications  Medication Dose Route Frequency Provider Last Rate Last Dose  . acetaminophen (TYLENOL) tablet 650 mg  650 mg Oral Q6H PRN Naomie Crow, MD      . alum & mag hydroxide-simeth (MAALOX/MYLANTA) 200-200-20 MG/5ML suspension 30 mL  30 mL Oral Q4H PRN Arla Boutwell, MD      . irbesartan (AVAPRO) tablet 300 mg  300 mg Oral Daily Johngabriel Verde, MD   300 mg at 06/28/16 8588  . magnesium hydroxide (MILK OF MAGNESIA) suspension 30 mL  30 mL Oral Daily PRN Antonella Upson, MD      . sertraline (ZOLOFT) tablet 25 mg  25 mg Oral Daily Teliyah Royal, MD   25 mg at 06/28/16 5027  . tamsulosin (FLOMAX) capsule 0.4 mg  0.4 mg Oral QPC supper Vence Lalor, MD   0.4 mg at 06/27/16 1729  . traZODone (DESYREL) tablet 50 mg  50 mg Oral QHS PRN Mantaj Chamberlin, MD   50 mg at 06/27/16 2105    Lab Results:  No results found for this or any previous visit (from the past 48 hour(s)).  Blood Alcohol level:  Lab Results  Component Value Date   [facility] <5 06/25/2016  Metabolic Disorder Labs: No results found for: HGBA1C, MPG No results found for: PROLACTIN No results found for: CHOL, TRIG, HDL, CHOLHDL, VLDL, LDLCALC  Physical Findings: AIMS:  , ,  ,  ,    CIWA:    COWS:     Musculoskeletal: Strength & Muscle Tone: within normal limits Gait & Station: normal Patient leans: N/A  Psychiatric Specialty Exam: Physical Exam  Review of Systems  Constitutional: Negative.   HENT: Negative.   Eyes: Negative.   Respiratory: Negative.   Cardiovascular: Negative.   Gastrointestinal: Positive for heartburn.  Genitourinary: Negative.   Musculoskeletal: Negative.   Skin: Negative.   Neurological: Negative.   Endo/Heme/Allergies: Negative.    Psychiatric/Behavioral: Positive for depression. The patient is nervous/anxious.     Blood pressure 138/78, pulse 78, temperature 98.3 F (36.8 C), resp. rate 18, height 5\' 11"  (1.803 m), weight 165 lb (74.8 kg), SpO2 99 %.Body mass index is 23.01 kg/m.  General Appearance: Casual  Eye Contact:  Fair  Speech:  Clear and Coherent  Volume:  Decreased  Mood:  Anxious and Dysphoric  Affect:  Appropriate  Thought Process:  Coherent  Orientation:  Full (Time, Place, and Person)  Thought Content:  Logical  Suicidal Thoughts:  No  Homicidal Thoughts:  No  Memory:  Immediate;   Fair Recent;   Fair Remote;   Fair  Judgement:  Fair  Insight:  Shallow  Psychomotor Activity:  Normal  Concentration:  Concentration: Fair and Attention Span: Fair  Recall:  [employer] of Knowledge:  Fair  Language:  Fair  Akathisia:  No  Handed:  Right  AIMS (if indicated):     Assets:  Communication Skills Desire for Improvement Financial Resources/Insurance Housing Physical Health Resilience Social Support Vocational/Educational  ADL's:  Intact  Cognition:  WNL  Sleep:  Number of Hours: 8     Treatment Plan Summary: Daily contact with patient to assess and evaluate symptoms and progress in treatment and Medication management   Patient is a 61 year old Caucasian male with the long history of depression who developed some aggressive behaviors and suicidal thoughts after he was given a course of steroids for a swollen hand and in the context of some other psychosocial stressors.  Major depressive disorder  Patient will be observed on a every 15 minute check. Increase Zoloft to 50 mg daily Patient to attend groups and participate in individual therapy to improve his coping skills.  Insomnia Continue trazodone at 50 mg at bedtime  Labs- CBC was normal at admission and normal metabolic panel, urine drug screen was positive for amphetamines but patient reported he was taking Adderall through his  psychiatrist  Hypertension Patient was taking valsartan, substituted with Irbesartan 300 mg in the hospital.  Prostatic hypertrophy Patient reports taking Flomax and will start him at the low dose of 0.4 mg daily in the evening  GERD Start Prilosec at 20 mg once daily  Disposition Discussed with patient that he could benefit from intensive outpatient therapy or partial hospitalization program once his discharge from here. Patient concerned about his job and states he does not have any more leave left. We will continue this discussion towards patient's discharge. He is willing to see a therapist and continue to follow-up with a psychiatrist. Patient not aware that his wife is planning to move out. Social worker will discuss this with him today and we will address his follow-up care and living situation tomorrow.  Elvin So, MD 06/28/2016, 9:44 AM

## 2016-06-28 NOTE — Progress Notes (Signed)
D: Pt denies SI/HI/AVH. Pt is pleasant and cooperative, affect is flat and sad. Patient was visible in the milieu, appears less anxious and he is interacting with peers and staff appropriately. Patient's  thoughts are organized no bizarre behavior noted.  A: Pt was offered support and encouragement. Pt was given scheduled medications. Pt was encouraged to attend groups. Q 15 minute checks were done for safety.  R:Pt attends groups and interacts well with peers and staff. Pt is complaint with medication. Pt has no complaints.Pt receptive to treatment and safety maintained on unit.

## 2016-06-28 NOTE — BHH Group Notes (Signed)
[facility] LCSW Group Therapy Note  Date/Time: 06/28/16, 0930  Type of Therapy/Topic:  Group Therapy:  Balance in Life  Participation Level:  moderate  Description of Group:    This group will address the concept of balance and how it feels and looks when one is unbalanced. Patients will be encouraged to process areas in their lives that are out of balance, and identify reasons for remaining unbalanced. Facilitators will guide patients utilizing problem- solving interventions to address and correct the stressor making their life unbalanced. Understanding and applying boundaries will be explored and addressed for obtaining  and maintaining a balanced life. Patients will be encouraged to explore ways to assertively make their unbalanced needs known to significant others in their lives, using other group members and facilitator for support and feedback.  Therapeutic Goals: 1. Patient will identify two or more emotions or situations they have that consume much of in their lives. 2. Patient will identify signs/triggers that life has become out of balance:  3. Patient will identify two ways to set boundaries in order to achieve balance in their lives:  4. Patient will demonstrate ability to communicate their needs through discussion and/or role plays  Summary of Patient Progress: Pt identified mental/emotional and work as areas that are out of balance in his life.  Pt also was able to identify a sign for him that his life is getting out of balance: being more irritable and angry.  Pt was somewhat quiet during group today but did participate.          Therapeutic Modalities:   Cognitive Behavioral Therapy Solution-Focused Therapy Assertiveness Training  Lurline Idol, [place]

## 2016-06-28 NOTE — BHH Group Notes (Signed)
Goals Group Date/Time: 06/28/2016 9:00 AM Type of Therapy and Topic: Group Therapy: Goals Group: SMART Goals   Participation Level: Moderate  Description of Group:    The purpose of a daily goals group is to assist and guide patients in setting recovery/wellness-related goals. The objective is to set goals as they relate to the crisis in which they were admitted. Patients will be using SMART goal modalities to set measurable goals. Characteristics of realistic goals will be discussed and patients will be assisted in setting and processing how one will reach their goal. Facilitator will also assist patients in applying interventions and coping skills learned in psycho-education groups to the SMART goal and process how one will achieve defined goal.   Therapeutic Goals:   -Patients will develop and document one goal related to or their crisis in which brought them into treatment.  -Patients will be guided by LCSW using SMART goal setting modality in how to set a measurable, attainable, realistic and time sensitive goal.  -Patients will process barriers in reaching goal.  -Patients will process interventions in how to overcome and successful in reaching goal.   Patient's Goal: Pt goal was to do activities designed to allow him to be discharged as quickly as possible: attend group, take medication, meet with his MD.   Therapeutic Modalities:  Motivational Interviewing  Cognitive Behavioral Therapy  Crisis Intervention Model  SMART goals setting   Lurline Idol, LCSW

## 2016-06-28 NOTE — Plan of Care (Signed)
Problem: Coping: Goal: Ability to verbalize feelings will improve Outcome: Progressing Patient verbalized feelings to staff.    

## 2016-06-29 MED ORDER — SERTRALINE HCL 50 MG PO TABS: 50.0000 mg | ORAL_TABLET | Freq: Every day | ORAL | 0 refills | Status: AC

## 2016-06-29 MED ORDER — TRAZODONE HCL 50 MG PO TABS: 50.0000 mg | ORAL_TABLET | Freq: Every evening | ORAL | 0 refills | Status: DC | PRN

## 2016-06-29 NOTE — Discharge Summary (Signed)
Physician Discharge Summary Note  Patient:  Brett Vance. is an 61 y.o., male MRN:  [id number] DOB:  1956/03/01 Patient phone:  [phone] (home)  Patient address:   [place] [address],  Total Time spent with patient: 45 minutes  Date of Admission:  06/26/2016 Date of Discharge: 06/29/2016  Reason for Admission:  Patient is a 61 year old Caucasian male who was admitted with suicidal thoughts and having thoughts to cut on a not tree or hang himself in the woods. He had been exhibiting more aggressive and angry behaviors over several weeks.  Principal Problem: <principal problem not specified> Discharge Diagnoses: Patient Active Problem List   Diagnosis Date Noted  . MDD (major depressive disorder), recurrent episode, severe ([place]) [F33.2] 06/26/2016    Past Psychiatric History: Patient did not have any previous psychiatric hospitalizations. He was seeing a psychiatrist for his depression.  Past Medical History:  Past Medical History:  Diagnosis Date  . Hypertension    History reviewed. No pertinent surgical history. Family History: History reviewed. No pertinent family history. Family Psychiatric  History: Denies Social History:  History  Alcohol Use No     History  Drug Use No    Social History   Social History  . Marital status: Married    Spouse name: N/A  . Number of children: N/A  . Years of education: N/A   Social History Main Topics  . Smoking status: Never Smoker  . Smokeless tobacco: Never Used  . Alcohol use No  . Drug use: No  . Sexual activity: Not Asked   Other Topics Concern  . None   Social History Narrative  . None    Hospital Course:  Patient was admitted to the behavioral health unit on the routine precautions of the every 15 minute checks. He was introduced to the therapeutic milieu and started on Zoloft at 25 mg and titrated to 50 mg and trazodone at 50 mg . Patient was quite cooperative on the unit and did  not have any disruptive or aggressive behaviors. He was cooperative and polite with staff and peers. He participated actively in his groups and reported that he learned a great deal about emotional regulation and how he also needed to take care of himself. SWspoke to his wife and wife also visited him every day. By the end of his hospital stay patient was ready for discharge and had developed some coping skills to deal with his stressors externally. Patient's wife was advocating for his discharge and felt like he was ready to come home. She did not think that patient was at risk to harm himself. Patient reported that he was having [place] chats with his wife every day during his hospitalization. By the day of his discharge patient presented calm and cooperative. He was alert and oriented to all spheres. He was excited about discharge but also appropriately nervous. He was amenable to recommendations that he take a week off from work. He did not have any perceptual disturbances. He denied any suicidal or homicidal thoughts. He appeared to have fair insight and judgment into his condition.  Physical Findings: AIMS:  , ,  ,  ,    CIWA:    COWS:     Musculoskeletal: Strength & Muscle Tone: within normal limits Gait & Station: normal Patient leans: N/A  Psychiatric Specialty Exam: Physical Exam  ROS  Blood pressure 139/80, pulse 77, temperature 98.2 F (36.8 C), temperature source Oral, resp. rate 18, height 5'  11" (1.803 m), weight 165 lb (74.8 kg), SpO2 96 %.Body mass index is 23.01 kg/m.  General Appearance: Casual  Eye Contact::  Fair  Speech:  Clear and [id number]  Volume:  Normal  Mood:  Anxious and Euthymic  Affect:  Appropriate  Thought Process:  Coherent  Orientation:  Full (Time, Place, and Person)  Thought Content:  Logical  Suicidal Thoughts:  No  Homicidal Thoughts:  No  Memory:  Immediate;   Fair Recent;   Fair Remote;   Fair  Judgement:  Fair  Insight:  Fair   Psychomotor Activity:  Normal  Concentration:  Fair  Recall:  [employer] of [place]  Language: Fair  Akathisia:  No  Handed:  Right  AIMS (if indicated):     Assets:  Communication Skills Desire for Improvement Financial Resources/Insurance Housing Physical Health Social Support Vocational/Educational  Sleep:  Number of Hours: 6  Cognition: WNL  ADL's:  Intact       Sleep:  Number of Hours: 6     Have you used any form of tobacco in the last 30 days? (Cigarettes, Smokeless Tobacco, Cigars, and/or Pipes): No  Has this patient used any form of tobacco in the last 30 days? (Cigarettes, Smokeless Tobacco, Cigars, and/or Pipes) Yes, No  Blood Alcohol level:  Lab Results  Component Value Date   ETH <5 42/35/3614    Metabolic Disorder Labs:  No results found for: HGBA1C, MPG No results found for: PROLACTIN No results found for: CHOL, TRIG, HDL, CHOLHDL, VLDL, LDLCALC  See Psychiatric Specialty Exam and Suicide Risk Assessment completed by Attending Physician prior to discharge.  Discharge destination:  Home  Is patient on multiple antipsychotic therapies at discharge:  No   Has Patient had three or more failed trials of antipsychotic monotherapy by history:  No  Recommended Plan for Multiple Antipsychotic Therapies: NA  Discharge Instructions    Diet - low sodium heart healthy    Complete by:  As directed    Increase activity slowly    Complete by:  As directed      Allergies as of 06/29/2016   No Known Allergies     Medication List    STOP taking these medications   HYDROcodone-acetaminophen 5-325 MG tablet Commonly known as:  NORCO   sulfacetamide 10 % ophthalmic solution Commonly known as:  BLEPH-10     TAKE these medications     Indication  sertraline 50 MG tablet Commonly known as:  ZOLOFT Take 1 tablet (50 mg total) by mouth daily.  Indication:  Major Depressive Disorder   traZODone 50 MG tablet Commonly known as:  DESYREL Take 1  tablet (50 mg total) by mouth at bedtime as needed for sleep.  Indication:  Trouble Sleeping   valsartan 160 MG tablet Commonly known as:  DIOVAN Take 160 mg by mouth 2 (two) times daily.  Indication:  High Blood Pressure Disorder      Follow-up Information    [facility] Follow up on 07/05/2016.   Specialty:  Behavioral Health Why:  Therapist appointment on this date with Graylin Shiver at 8:00am. Bring photo I.D. and insurance information with you to this appointment.  Contact information: [facility] [place] [facility] Follow up on 07/03/2016.   Why:  Follow-up appointment on this date at 12:30pm for medication management. Bring Photo I.D., insurance information, discharge summary, and current medications  with you to this appointment.  Contact information: [place] [address] [phone]           Follow-up recommendations:  Activity:  normal Diet:  low fat and low salt  Comments:  Discussed with patient comprehensively about the coping skills he has learned in the hospital and about his ability to cope with his stressors post discharge. Patient presented with a good understanding of 4 to him to this hospitalization and how he would do put some of his coping skills to use once his discharge. Social worker has talked to his wife and she is very comfortable with patient being discharged. Patient understands importance of follow-up with psychiatrist and with therapy.   SignedElvin So, MD 06/29/2016, 12:17 PM

## 2016-06-29 NOTE — Tx Team (Signed)
Interdisciplinary Treatment and Diagnostic Plan Update  06/29/2016 Time of Session: [place]. MRN: [id number]  Principal Diagnosis: <principal problem not specified>  Secondary Diagnoses: Active Problems:   MDD (major depressive disorder), recurrent episode, severe (HCC)   Current Medications:  Current Facility-Administered Medications  Medication Dose Route Frequency Provider Last Rate Last Dose  . acetaminophen (TYLENOL) tablet 650 mg  650 mg Oral Q6H PRN Himabindu Ravi, MD      . alum & mag hydroxide-simeth (MAALOX/MYLANTA) 200-200-20 MG/5ML suspension 30 mL  30 mL Oral Q4H PRN Himabindu Ravi, MD      . irbesartan (AVAPRO) tablet 300 mg  300 mg Oral Daily Himabindu Ravi, MD   300 mg at 06/29/16 0820  . magnesium hydroxide (MILK OF MAGNESIA) suspension 30 mL  30 mL Oral Daily PRN Himabindu Ravi, MD      . pantoprazole (PROTONIX) EC tablet 40 mg  40 mg Oral Daily Himabindu Ravi, MD   40 mg at 06/28/16 2133  . sertraline (ZOLOFT) tablet 50 mg  50 mg Oral Daily Himabindu Ravi, MD   50 mg at 06/29/16 0086  . tamsulosin (FLOMAX) capsule 0.4 mg  0.4 mg Oral QPC supper Himabindu Ravi, MD   0.4 mg at 06/28/16 1641  . traZODone (DESYREL) tablet 50 mg  50 mg Oral QHS PRN Himabindu Ravi, MD   50 mg at 06/28/16 2117   PTA Medications: Prescriptions Prior to Admission  Medication Sig Dispense Refill Last Dose  . HYDROcodone-acetaminophen (NORCO) 5-325 MG tablet Take 1-2 tablets by mouth every 4 (four) hours as needed for moderate pain. 15 tablet 0   . sulfacetamide (BLEPH-10) 10 % ophthalmic solution Place 2 drops into the right eye 4 (four) times daily. 5 mL 0   . valsartan (DIOVAN) 160 MG tablet Take 160 mg by mouth 2 (two) times daily.       Patient Stressors: Financial difficulties Marital or family conflict Medication change or noncompliance  Patient Strengths: Average or above average intelligence Capable of independent living Communication skills Supportive  family/friends  Treatment Modalities: Medication Management, Group therapy, Case management,  1 to 1 session with clinician, Psychoeducation, Recreational therapy.   Physician Treatment Plan for Primary Diagnosis: <principal problem not specified> Long Term Goal(s): Improvement in symptoms so as ready for discharge   Short Term Goals: Ability to identify changes in lifestyle to reduce recurrence of condition will improve Ability to verbalize feelings will improve Ability to disclose and discuss suicidal ideas Ability to demonstrate self-control will improve Ability to identify and develop effective coping behaviors will improve Ability to maintain clinical measurements within normal limits will improve Compliance with prescribed medications will improve Ability to identify triggers associated with substance abuse/mental health issues will improve  Medication Management: Evaluate patient's response, side effects, and tolerance of medication regimen.  Therapeutic Interventions: 1 to 1 sessions, Unit Group sessions and Medication administration.  Evaluation of Outcomes: Adequate for Discharge  Physician Treatment Plan for Secondary Diagnosis: Active Problems:   MDD (major depressive disorder), recurrent episode, severe ([place])  Long Term Goal(s): Improvement in symptoms so as ready for discharge   Short Term Goals: Ability to identify changes in lifestyle to reduce recurrence of condition will improve Ability to verbalize feelings will improve Ability to disclose and discuss suicidal ideas Ability to demonstrate self-control will improve Ability to identify and develop effective coping behaviors will improve Ability to maintain clinical measurements within normal limits will improve Compliance with prescribed medications will improve Ability to identify  triggers associated with substance abuse/mental health issues will improve     Medication Management: Evaluate patient's response,  side effects, and tolerance of medication regimen.  Therapeutic Interventions: 1 to 1 sessions, Unit Group sessions and Medication administration.  Evaluation of Outcomes: Adequate for Discharge   RN Treatment Plan for Primary Diagnosis: <principal problem not specified> Long Term Goal(s): Knowledge of disease and therapeutic regimen to maintain health will improve  Short Term Goals: Ability to demonstrate self-control, Ability to identify and develop effective coping behaviors will improve and Compliance with prescribed medications will improve  Medication Management: RN will administer medications as ordered by provider, will assess and evaluate patient's response and provide education to patient for prescribed medication. RN will report any adverse and/or side effects to prescribing provider.  Therapeutic Interventions: 1 on 1 counseling sessions, Psychoeducation, Medication administration, Evaluate responses to treatment, Monitor vital signs and CBGs as ordered, Perform/monitor CIWA, COWS, AIMS and Fall Risk screenings as ordered, Perform wound care treatments as ordered.  Evaluation of Outcomes: Adequate for Discharge   LCSW Treatment Plan for Primary Diagnosis: <principal problem not specified> Long Term Goal(s): Safe transition to appropriate next level of care at discharge, Engage patient in therapeutic group addressing interpersonal concerns.  Short Term Goals: Engage patient in aftercare planning with referrals and resources, Increase ability to appropriately verbalize feelings, Increase emotional regulation and Increase skills for wellness and recovery  Therapeutic Interventions: Assess for all discharge needs, 1 to 1 time with Social worker, Explore available resources and support systems, Assess for adequacy in community support network, Educate family and significant other(s) on suicide prevention, Complete Psychosocial Assessment, Interpersonal group therapy.  Evaluation of  Outcomes: Adequate for Discharge   Progress in Treatment: Attending groups: Yes. Participating in groups: Yes. Taking medication as prescribed: Yes. Toleration medication: Yes. Family/Significant other contact made: Yes, individual(s) contacted:  wife Patient understands diagnosis: Yes. Discussing patient identified problems/goals with staff: Yes. Medical problems stabilized or resolved: Yes. Denies suicidal/homicidal ideation: Yes. Issues/concerns per patient self-inventory: No. Other: none  New problem(s) identified: No, Describe:  none  New Short Term/Long Term Goal(s):Pt reports his goal is to discharge quickly but also to work on emotional regulation.  Discharge Plan or Barriers: CSW assessing for appropriate plan.  Reason for Continuation of Hospitalization: discharging today.  Estimated Length of Stay: discharging today.  Attendees: Patient: Brett Vance [facility] 06/29/2016   Physician: Dr. Einar Grad, MD 06/29/2016   Nursing: Elige Radon, RN 06/29/2016   RN Care Manager:    Social Worker: Dorena Bodo, MSW, LCSW 06/29/2016   Recreational Therapist:    Other:    Other:    Other:         Scribe for Treatment Team: Joanne Chars, LCSW 06/29/2016 12:30 PM

## 2016-06-29 NOTE — Progress Notes (Signed)
Denies SI/HI/AVH.   Discharge instructions given, verbalized understanding.  Prescriptions given and personal belongings returned.  Escorted off unit by this Probation officer to main entrance to travel home with wife.

## 2016-06-29 NOTE — BHH Group Notes (Signed)
[place] LCSW Group Therapy Note  Date/Time: 06/29/16, 0930  Type of Therapy and Topic:  Group Therapy:  Feelings around Relapse and Recovery  Participation Level:  Active   Mood: pleasant  Description of Group:    Patients in this group will discuss emotions they experience before and after a relapse. They will process how experiencing these feelings, or avoidance of experiencing them, relates to having a relapse. Facilitator will guide patients to explore emotions they have related to recovery. Patients will be encouraged to process which emotions are more powerful. They will be guided to discuss the emotional reaction significant others in their lives may have to patients' relapse or recovery. Patients will be assisted in exploring ways to respond to the emotions of others without this contributing to a relapse.  Therapeutic Goals: 1. Patient will identify two or more emotions that lead to relapse for them:  2. Patient will identify two emotions that result when they relapse:  3. Patient will identify two emotions related to recovery:  4. Patient will demonstrate ability to communicate their needs through discussion and/or role plays.   Summary of Patient Progress: Pt was not overly active today and only shared that his tendency with negative feelings is to bottle them up until he explodes.  He did make several appropriate suggestions to other group members throughout the discussion and was engaged the entire time.     Therapeutic Modalities:   Cognitive Behavioral Therapy Solution-Focused Therapy Assertiveness Training Relapse Prevention Therapy  Lurline Idol, LCSW

## 2016-06-29 NOTE — BHH Group Notes (Signed)
CSW spoke with pt's wife, Butch Penny, regarding her plans to stay with one of her children for a period of time rather than be at home with pt.  She said she spoke with pt about this last night and has been staying there since Monday.  She said that she and pt had a good conversation about this and pt agreed that this was a good idea for now.  Butch Penny is not pursuing a permanent separation.  She would be in favor of pt being discharged because she thinks it would be helpful in several ways for him to be able to work the next few days.  She does not have concerns if pt is discharged at this point and will pick him up and check in with him once he is discharged. Winferd Humphrey, MSW, LCSW Clinical Social Worker 06/29/2016 8:55 AM

## 2016-06-29 NOTE — BHH Suicide Risk Assessment (Signed)
[facility] Discharge Suicide Risk Assessment   Principal Problem: <principal problem not specified> Discharge Diagnoses:  Patient Active Problem List   Diagnosis Date Noted  . MDD (major depressive disorder), recurrent episode, severe ([place]) [F33.2] 06/26/2016    Total Time spent with patient: 45 minutes  Musculoskeletal: Strength & Muscle Tone: within normal limits Gait & Station: normal Patient leans: N/A  Psychiatric Specialty Exam: ROS  Blood pressure 139/80, pulse 77, temperature 98.2 F (36.8 C), temperature source Oral, resp. rate 18, height 5\' 11"  (1.803 m), weight 165 lb (74.8 kg), SpO2 96 %.Body mass index is 23.01 kg/m.  General Appearance: Casual  Eye Contact::  Fair  Speech:  Clear and [id number]  Volume:  Normal  Mood:  Anxious and Euthymic  Affect:  Appropriate  Thought Process:  Coherent  Orientation:  Full (Time, Place, and Person)  Thought Content:  Logical  Suicidal Thoughts:  No  Homicidal Thoughts:  No  Memory:  Immediate;   Fair Recent;   Fair Remote;   Fair  Judgement:  Fair  Insight:  Fair  Psychomotor Activity:  Normal  Concentration:  Fair  Recall:  [employer] [place]  Language: Fair  Akathisia:  No  Handed:  Right  AIMS (if indicated):     Assets:  Communication Skills Desire for Improvement Financial Resources/Insurance Housing Physical Health Social Support Vocational/Educational  Sleep:  Number of Hours: 6  Cognition: WNL  ADL's:  Intact      Demographic Factors:  Male and Caucasian  Loss Factors: Decrease in vocational status  Historical Factors: NA  Risk Reduction Factors:   Sense of responsibility to family, Positive social support, Positive therapeutic relationship and Positive coping skills or problem solving skills  Continued Clinical Symptoms:  Improved mood  Cognitive Features That Contribute To Risk:  None    Suicide Risk:  Minimal: No identifiable suicidal ideation.  Patients presenting with no  risk factors but with morbid ruminations; may be classified as minimal risk based on the severity of the depressive symptoms  Follow-up [facility] Follow up on 07/05/2016.   Specialty:  Behavioral Health Why:  Therapist appointment on this date with Graylin Shiver at 8:00am. Bring photo I.D. and insurance information with you to this appointment.  Contact information: [facility] Follow up on 07/03/2016.   Why:  Follow-up appointment on this date at 12:30pm for medication management. Bring Photo I.D., insurance information, discharge summary, and current medications with you to this appointment.  Contact information: [place] [address] [phone]           Plan Of Care/Follow-up recommendations:  Activity:  as tolerated Diet:  low salt and low fat   Comments: Discussed with patient comprehensively about the coping skills he has learned in the hospital and about his ability to cope with his stressors post discharge. Patient presented with a good understanding of 4 to him to this hospitalization and how he would do put some of his coping skills to use once his discharge. Social worker has talked to his wife and she is very comfortable with patient being discharged. Patient understands importance of follow-up with psychiatrist and with therapy.  Elvin So, MD 06/29/2016, 12:13 PM

## 2016-06-29 NOTE — Progress Notes (Signed)
D: Pt denies SI/HI/AVH. Pt is pleasant and cooperative. Pt isolates to self minimal interaction with peers and staff. Patient appears less anxious, noted reading this evening and visible in the milieu.  A: Pt was offered support and encouragement. Pt was given scheduled medications. Pt was encouraged to attend groups. Q 15 minute checks were done for safety.  R:Pt attends groups and interacts well with peers and staff. Pt is taking medication. Pt has no complaints.Pt receptive to treatment and safety maintained on unit.

## 2016-06-29 NOTE — Progress Notes (Signed)
  [facility] Adult Case Management Discharge Plan :  Will you be returning to the same living situation after discharge:  Yes,  home At discharge, do you have transportation home?: Yes,  wife Do you have the ability to pay for your medications: Yes,  BCBS  Release of information consent forms completed and in the chart;  Patient's signature needed at discharge.  Patient to Follow up at: Follow-up Information    [facility] Follow up on 07/05/2016.   Specialty:  Behavioral Health Why:  Therapist appointment on this date with Graylin Shiver at 8:00am. Bring photo I.D. and insurance information with you to this appointment.  Contact information: [facility] Follow up on 07/03/2016.   Why:  Follow-up appointment on this date at 12:30pm for medication management. Bring Photo I.D., insurance information, discharge summary, and current medications with you to this appointment.  Contact information: [place] [phone] [phone]           Next level of care provider has access to [place], no [facility].  Safety Planning and Suicide Prevention discussed: Yes,  with wife  Have you used any form of tobacco in the last 30 days? (Cigarettes, Smokeless Tobacco, Cigars, and/or Pipes): No  Has patient been referred to the Quitline?: N/A patient is not a smoker  Patient has been referred for addiction treatment: N/A  Joanne Chars, LCSW 06/29/2016, 12:32 PM

## 2016-06-29 NOTE — BHH Group Notes (Signed)
[facility] Group Notes:  (Nursing/MHT/Case Management/Adjunct)  Date:  06/29/2016  Time:  12:21 AM  Type of Therapy:  Psychoeducational Skills  Participation Level:  Active  Participation Quality:  Appropriate and Sharing  Affect:  Appropriate  Cognitive:  Appropriate  Insight:  Appropriate and Good  Engagement in Group:  Engaged  Modes of Intervention:  Discussion, Socialization and Support  Summary of Progress/Problems:  Reece Agar 06/29/2016, 12:21 AM

## 2016-07-05 ENCOUNTER — Ambulatory Visit (INDEPENDENT_AMBULATORY_CARE_PROVIDER_SITE_OTHER): Payer: BLUE CROSS/BLUE SHIELD | Admitting: Licensed Clinical Social Worker

## 2016-07-05 ENCOUNTER — Encounter: Payer: Self-pay | Admitting: Licensed Clinical Social Worker

## 2016-07-05 DIAGNOSIS — F332 Major depressive disorder, recurrent severe without psychotic features: Secondary | ICD-10-CM | POA: Diagnosis not present
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
## 2016-07-19 ENCOUNTER — Ambulatory Visit (INDEPENDENT_AMBULATORY_CARE_PROVIDER_SITE_OTHER): Payer: BLUE CROSS/BLUE SHIELD | Admitting: Licensed Clinical Social Worker

## 2016-07-19 DIAGNOSIS — F332 Major depressive disorder, recurrent severe without psychotic features: Secondary | ICD-10-CM | POA: Diagnosis not present

## 2016-07-20 NOTE — Progress Notes (Signed)
   THERAPIST PROGRESS NOTE  Session Time: 34mn  Participation Level: Active  Behavioral Response: Neat and Well GroomedAlertDepressed  Type of Therapy: Individual Therapy  Treatment Goals addressed: Coping  Interventions: CBT, Motivational Interviewing and Solution Focused  Summary: [name] is a 61y.o. male who presents with continued symptoms of his diagnosis.   Patient reports that he feel hopeless. He reports that people have turned there back on him.  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  He reports that his wife left the home and is temporarily living with their son.  He reports that his life is in [place]  He reports that all he does is work and sleep.  He denies an appetite or interaction with friends/family.  He reports that church is his only outlet which he no longer has.  LCSW discussed what psychotherapy is and is not and the importance of the therapeutic relationship to include open and honest communication between client and therapist and building trust.  Reviewed advantages and disadvantages of the therapeutic process and limitations to the therapeutic relationship including LCSW's role in maintaining the safety of the client, others and those in client's care.   Suicidal/Homicidal: No  Therapist Response: Therapist met with Patient in an initial therapy session to assess current mood and to build rapport. Therapist engaged Patient in discussion about his life and what is going well for him. Therapist provided support for Patient as he shared details about his life, current stressors, mood, coping skills, past trauma, and his wife & children. Therapist prompted Patient to discuss his support system and ways that he manages his daily stress, anger, and frustrations. Completed treatment plan  Plan: Return again in 2 weeks.  Diagnosis: Axis I: Major Depression, Recurrent severe    Axis II: No  diagnosis    NLubertha South LCSW 07/20/2016

## 2016-08-09 ENCOUNTER — Other Ambulatory Visit: Payer: Self-pay | Admitting: Internal Medicine

## 2016-08-09 ENCOUNTER — Ambulatory Visit (INDEPENDENT_AMBULATORY_CARE_PROVIDER_SITE_OTHER): Payer: BLUE CROSS/BLUE SHIELD | Admitting: Licensed Clinical Social Worker

## 2016-08-09 DIAGNOSIS — F332 Major depressive disorder, recurrent severe without psychotic features: Secondary | ICD-10-CM

## 2016-08-09 DIAGNOSIS — R1310 Dysphagia, unspecified: Secondary | ICD-10-CM

## 2016-08-13 NOTE — Progress Notes (Signed)
   THERAPIST PROGRESS NOTE  Session Time: 77min  Participation Level: Active  Behavioral Response: Neat and Well GroomedAlertDepressed  Type of Therapy: Individual Therapy  Treatment Goals addressed: Coping  Interventions: CBT, Motivational Interviewing and Solution Focused  Summary: Brett Vance. is a 61 y.o. male who presents with continued symptoms of his diagnosis.  Patient reports that he is "all over the place."  He reports that he is losing track of time due to difficulty concentrating.  Patient reports a "better mood" than before.  Patient reports that he works and attempts to clean up his home.  Reports that 15 minutes per day is working and he has noticed a huge difference in his kitchen and spare bedroom.  Discussion of coping skills and triggers.   Suicidal/Homicidal: No  Therapist Response: LCSW provided Patient with ongoing emotional support and encouragement.     Plan: Return again in 2 weeks.  Diagnosis: Axis I: Major Depression, Recurrent severe    Axis II: No diagnosis    Lubertha South, LCSW 08/09/2016

## 2016-08-23 ENCOUNTER — Ambulatory Visit (INDEPENDENT_AMBULATORY_CARE_PROVIDER_SITE_OTHER): Payer: BLUE CROSS/BLUE SHIELD | Admitting: Licensed Clinical Social Worker

## 2016-08-23 DIAGNOSIS — F332 Major depressive disorder, recurrent severe without psychotic features: Secondary | ICD-10-CM | POA: Diagnosis not present

## 2016-08-23 NOTE — Progress Notes (Signed)
   THERAPIST PROGRESS NOTE  Session Time: 64min  Participation Level: Active  Behavioral Response: Neat and Well GroomedAlertDepressed  Type of Therapy: Family Therapy  Treatment Goals addressed: Coping  Interventions: CBT, Motivational Interviewing and Solution Focused  Summary: Brett Vance. is a 61 y.o. male who presents with continued symptoms of his diagnosis. Patient arrived with his estranged wife to discuss his symptoms, thoughts and feelings.  Patient report his current stressors.  Patient reports the pros and cons since the previous session.  Patient has an all or nothing thought process.  Patient stated that he wants people to treat him like he treats other.  Patient reports that he has not had a verbal or physical outburst since discharge from hospital.  Patient denies self harm(cutting). Patient to continue journaling, and taking 15 minutes out of his day to clean the home. Patient to look in mirror and verbalize what he sees.  Suicidal/Homicidal: No  Therapist Response: LCSW provided Patient with ongoing emotional support and encouragement.     Plan: Return again in 2 weeks.  Diagnosis: Axis I: Major Depression, Recurrent severe    Axis II: No diagnosis    Lubertha South, LCSW 08/23/2016

## 2016-08-29 ENCOUNTER — Ambulatory Visit (INDEPENDENT_AMBULATORY_CARE_PROVIDER_SITE_OTHER): Payer: BLUE CROSS/BLUE SHIELD | Admitting: Licensed Clinical Social Worker

## 2016-08-29 DIAGNOSIS — F332 Major depressive disorder, recurrent severe without psychotic features: Secondary | ICD-10-CM

## 2016-09-03 NOTE — Progress Notes (Signed)
   THERAPIST PROGRESS NOTE  Session Time: 27min  Participation Level: Active  Behavioral Response: Neat and Well GroomedAlertDepressed  Type of Therapy: Family Therapy  Treatment Goals addressed: Coping  Interventions: CBT, Motivational Interviewing and Solution Focused  Summary: Brett Vance. is a 61 y.o. male who presents with continued symptoms of his diagnosis. Patient arrived late. Patient reports that he is doing better than 2 weeks ago.  He reports that he is laying in the bed less and is eating at least 2 times daily.  Patient narrated his past two weeks and how he is able to maintain his current positive mood.  Suicidal/Homicidal: No  Therapist Response: LCSW provided Patient with ongoing emotional support and encouragement.     Plan: Return again in 2 weeks.  Diagnosis: Axis I: Major Depression, Recurrent severe    Axis II: No diagnosis    Lubertha South, LCSW 08/31/2016

## 2016-09-06 ENCOUNTER — Ambulatory Visit (INDEPENDENT_AMBULATORY_CARE_PROVIDER_SITE_OTHER): Payer: BLUE CROSS/BLUE SHIELD | Admitting: Licensed Clinical Social Worker

## 2016-09-06 DIAGNOSIS — F332 Major depressive disorder, recurrent severe without psychotic features: Secondary | ICD-10-CM | POA: Diagnosis not present

## 2016-09-07 NOTE — Progress Notes (Signed)
   THERAPIST PROGRESS NOTE  Session Time: 36min  Participation Level: Active  Behavioral Response: Neat and Well GroomedAlertDepressed  Type of Therapy: Family Therapy  Treatment Goals addressed: Coping  Interventions: CBT, Motivational Interviewing and Solution Focused  Summary: Brett Vance. is a 61 y.o. male who presents with continued symptoms of his diagnosis. Patient arrived with his estranged wife to discuss his symptoms, thoughts and feelings.  Patient report his current stressors.  Patient reports that he is beginning to feel better emotionally aeb increase in energy, increase in motivation and positive talk.  Patient reports that he wants to build a better bond with his wife.  He reports that at times she has unrealistic expectations of him.  Wife reports that she is angry about his previous behavior.  Explored "priorities".  Suicidal/Homicidal: No  Therapist Response: LCSW provided Patient with ongoing emotional support and encouragement.     Plan: Return again in 2 weeks.  Diagnosis: Axis I: Major Depression, Recurrent severe    Axis II: No diagnosis    Lubertha South, LCSW 09/07/2016

## 2016-09-11 ENCOUNTER — Ambulatory Visit: Payer: BLUE CROSS/BLUE SHIELD | Admitting: Licensed Clinical Social Worker

## 2016-09-18 ENCOUNTER — Other Ambulatory Visit: Payer: Self-pay | Admitting: Internal Medicine

## 2016-09-18 DIAGNOSIS — R1310 Dysphagia, unspecified: Secondary | ICD-10-CM

## 2016-09-20 ENCOUNTER — Ambulatory Visit (INDEPENDENT_AMBULATORY_CARE_PROVIDER_SITE_OTHER): Payer: BLUE CROSS/BLUE SHIELD | Admitting: Licensed Clinical Social Worker

## 2016-09-20 DIAGNOSIS — F332 Major depressive disorder, recurrent severe without psychotic features: Secondary | ICD-10-CM

## 2016-09-25 ENCOUNTER — Ambulatory Visit
Admission: RE | Admit: 2016-09-25 | Discharge: 2016-09-25 | Disposition: A | Discharge status: Home / Self Care | Payer: BLUE CROSS/BLUE SHIELD | Source: Ambulatory Visit | Attending: Internal Medicine | Admitting: Internal Medicine

## 2016-09-25 DIAGNOSIS — K219 Gastro-esophageal reflux disease without esophagitis: Secondary | ICD-10-CM | POA: Insufficient documentation

## 2016-09-25 DIAGNOSIS — I119 Hypertensive heart disease without heart failure: Secondary | ICD-10-CM | POA: Insufficient documentation

## 2016-09-25 DIAGNOSIS — I872 Venous insufficiency (chronic) (peripheral): Secondary | ICD-10-CM

## 2016-09-25 DIAGNOSIS — R1310 Dysphagia, unspecified: Secondary | ICD-10-CM | POA: Diagnosis not present

## 2016-09-25 HISTORY — DX: Venous insufficiency (chronic) (peripheral): I87.2

## 2016-09-27 ENCOUNTER — Ambulatory Visit (INDEPENDENT_AMBULATORY_CARE_PROVIDER_SITE_OTHER): Payer: BLUE CROSS/BLUE SHIELD | Admitting: Licensed Clinical Social Worker

## 2016-09-27 ENCOUNTER — Other Ambulatory Visit: Payer: Self-pay | Admitting: Internal Medicine

## 2016-09-27 DIAGNOSIS — M79662 Pain in left lower leg: Secondary | ICD-10-CM

## 2016-09-27 DIAGNOSIS — F332 Major depressive disorder, recurrent severe without psychotic features: Secondary | ICD-10-CM

## 2016-09-27 NOTE — Progress Notes (Signed)
   THERAPIST PROGRESS NOTE  Session Time: 45min  Participation Level: Active  Behavioral Response: Neat and Well GroomedAlertEuthymic  Type of Therapy: Family Therapy  Treatment Goals addressed: Coping  Interventions: CBT, Motivational Interviewing and Solution Focused  Summary: Brett Vance. is a 61 y.o. male who presents with continued symptoms of his diagnosis. Patient was able to express his stressors, frustration and triumphs.  Patient reports that his wife is out of town for 2 weeks.  He reports that he feels "relieved" that she is on vacation since he can have time to be "himself."  Patient reports that he wants his wife to know how important she is to him.  Patient reports taht he wants to improve communication and increase the amount of time that they spend together (in person & on phone). He reports that he is on vacation and has a few goals. Patient explored "importance" and what that means to him.  Suicidal/Homicidal: No  Therapist Response: LCSW explored with patient "importance" and assisted him with defining the term and how to ensure other people feel as though they are important to him.   Plan: Return again in 2 weeks.  Diagnosis: Axis I: Major Depression, Recurrent severe    Axis II: No diagnosis    Lubertha South, LCSW 09/27/2016

## 2016-09-28 ENCOUNTER — Ambulatory Visit
Admission: RE | Admit: 2016-09-28 | Discharge: 2016-09-28 | Disposition: A | Discharge status: Home / Self Care | Payer: BLUE CROSS/BLUE SHIELD | Source: Ambulatory Visit | Attending: Internal Medicine | Admitting: Internal Medicine

## 2016-09-28 DIAGNOSIS — M79662 Pain in left lower leg: Secondary | ICD-10-CM | POA: Diagnosis present

## 2016-10-04 NOTE — Progress Notes (Signed)
   THERAPIST PROGRESS NOTE  Session Time: 98min  Participation Level: Active  Behavioral Response: Neat and Well GroomedAlertDepressed  Type of Therapy: Family Therapy  Treatment Goals addressed: Coping  Interventions: CBT, Motivational Interviewing and Solution Focused  Summary: Brett Vance. is a 61 y.o. male who presents with continued symptoms of his diagnosis. Patient was able to explore his progress and regression for the past week.  He reports that he continues to clean his home for at least 15 minutes per day but is unable to notice progress.  He reports that he has thought about priorities and how he needs to also place boundaries in his life.  Explore what boundaries are & how to obtain them  Suicidal/Homicidal: No  Therapist Response: LCSW provided Patient with ongoing emotional support and encouragement.     Plan: Return again in 2 weeks.  Diagnosis: Axis I: Major Depression, Recurrent severe    Axis II: No diagnosis    Lubertha South, LCSW 09/21/2016

## 2016-10-11 ENCOUNTER — Ambulatory Visit (INDEPENDENT_AMBULATORY_CARE_PROVIDER_SITE_OTHER): Payer: BLUE CROSS/BLUE SHIELD | Admitting: Licensed Clinical Social Worker

## 2016-10-11 DIAGNOSIS — F332 Major depressive disorder, recurrent severe without psychotic features: Secondary | ICD-10-CM | POA: Diagnosis not present

## 2016-10-18 ENCOUNTER — Ambulatory Visit (INDEPENDENT_AMBULATORY_CARE_PROVIDER_SITE_OTHER): Payer: BLUE CROSS/BLUE SHIELD | Admitting: Licensed Clinical Social Worker

## 2016-10-18 DIAGNOSIS — F332 Major depressive disorder, recurrent severe without psychotic features: Secondary | ICD-10-CM | POA: Diagnosis not present

## 2016-11-01 ENCOUNTER — Ambulatory Visit: Payer: BLUE CROSS/BLUE SHIELD | Admitting: Licensed Clinical Social Worker

## 2016-11-06 NOTE — Progress Notes (Signed)
   THERAPIST PROGRESS NOTE  Session Time: 63min  Participation Level: Active  Behavioral Response: Neat and Well GroomedAlertEuthymic  Type of Therapy: Family Therapy  Treatment Goals addressed: Coping  Interventions: CBT, Motivational Interviewing and Solution Focused  Summary: Brett Vance. is a 61 y.o. male who presents with continued symptoms of his diagnosis. Patient was able to express his stressors, frustration and triumphs.  Patient reports that he has noticed improvements in his mood.  He reports that he struggles with "being spontaneous".  He reports that his estranged wife wants him to make effort in putting her first.  He reports that he invited her to lunch but was unsuccessful due to restaurant being overcrowded.  Reports that he will try again soon.  Reports that he is continuing to clean his house for 15 minutes at a time.  Suicidal/Homicidal: No  Therapist Response: LCSW provided Patient with ongoing emotional support and encouragement.  Normalized his feelings.       Plan: Return again in 2 weeks.  Diagnosis: Axis I: Major Depression, Recurrent severe    Axis II: No diagnosis    Lubertha South, LCSW 10/11/2016

## 2016-11-12 NOTE — Progress Notes (Signed)
   THERAPIST PROGRESS NOTE  Session Time: 15min  Participation Level: Active  Behavioral Response: Neat and Well GroomedAlertEuthymic  Type of Therapy: Family Therapy  Treatment Goals addressed: Coping  Interventions: CBT, Motivational Interviewing and Solution Focused  Summary: Brett Vance. is a 61 y.o. male who presents with continued symptoms of his diagnosis.Patient was able to vent his struggles since the past session.  At this time Patient is currently having difficulty with his estranged marriage.  He is unsure if he wants things to go back to his wife and children living with him.  He reports that he feels as if his behavior is under a microscope.  He reports that he is unable to continue to feel like a disappointment to his family.  Factors that contribute to client's ongoing depressive symptoms were discussed and include real and perceived feelings of isolation, criticism, rejection, shame and guilt. Patient reports no cutting or aggressive behavior in the past month.   Suicidal/Homicidal: No  Therapist Response: LCSW provided Patient with ongoing emotional support and encouragement.  Normalized his feelings.       Plan: Return again in 2 weeks.  Diagnosis: Axis I: Major Depression, Recurrent severe    Axis II: No diagnosis    Lubertha South, LCSW 10/18/2016

## 2016-11-22 ENCOUNTER — Ambulatory Visit (INDEPENDENT_AMBULATORY_CARE_PROVIDER_SITE_OTHER): Payer: BLUE CROSS/BLUE SHIELD | Admitting: Licensed Clinical Social Worker

## 2016-11-22 DIAGNOSIS — F332 Major depressive disorder, recurrent severe without psychotic features: Secondary | ICD-10-CM

## 2016-12-06 ENCOUNTER — Ambulatory Visit (INDEPENDENT_AMBULATORY_CARE_PROVIDER_SITE_OTHER): Payer: BLUE CROSS/BLUE SHIELD | Admitting: Licensed Clinical Social Worker

## 2016-12-06 DIAGNOSIS — F332 Major depressive disorder, recurrent severe without psychotic features: Secondary | ICD-10-CM

## 2016-12-13 NOTE — Progress Notes (Signed)
   THERAPIST PROGRESS NOTE  Session Time: 25min  Participation Level: Active  Behavioral Response: Neat and Well GroomedAlertEuthymic  Type of Therapy: Family Therapy  Treatment Goals addressed: Coping  Interventions: CBT, Motivational Interviewing and Solution Focused  Summary: Brett Vance. is a 61 y.o. male who presents with continued symptoms of his diagnosis. Patient reports that he has been "doing well."  Patient reports that he has not been back to work due to medical concerns.  Patient reports that he has not completed the task of cleaning his garage fully.  He reports that progress is being made.  He was able to learn muscle relaxation. Patient reports no cutting or aggressive behavior in the past month.   Suicidal/Homicidal: No  Therapist Response: Prompted Patient for his thought's regarding his efforts to stabilize his mental health symptoms over the past week.  Encouraged Patient to build a larger support network. Engaged Patient in the development of effective relaxation techniques to better manage overwhelming feelings and decrease adverse reactive behaviors.  Provided psychoeducation on focused breathing and progressive muscle relaxation via "Topic 8a: Relaxation Techniques" (IMR: Practitioner Guides and Handouts, p.285). Modeled the aforementioned relaxation skills for Patient before engaging him in a cognitive rehearsal of the activities to ensure independent ease of use, comfort, and effectiveness.  Assessed Patient's progress toward goal attainment       Plan: Return again in 2 weeks.  Diagnosis: Axis I: Major Depression, Recurrent severe    Axis II: No diagnosis    Lubertha South, LCSW 12/06/2016

## 2016-12-20 ENCOUNTER — Ambulatory Visit (INDEPENDENT_AMBULATORY_CARE_PROVIDER_SITE_OTHER): Payer: BLUE CROSS/BLUE SHIELD | Admitting: Licensed Clinical Social Worker

## 2016-12-20 DIAGNOSIS — F332 Major depressive disorder, recurrent severe without psychotic features: Secondary | ICD-10-CM

## 2016-12-24 NOTE — Progress Notes (Signed)
   THERAPIST PROGRESS NOTE  Session Time: 75min  Participation Level: Active  Behavioral Response: Neat and Well GroomedAlertEuthymic  Type of Therapy: Family Therapy  Treatment Goals addressed: Coping  Interventions: CBT, Motivational Interviewing and Solution Focused  Summary: Brett Vance. is a 61 y.o. male who presents with continued symptoms of his diagnosis. Patient  reports that he is doing the best he can, but he gets frustrated often.  Patient reports that his behavior is an 8 because sometimes he is able to think prior to reacting.  Patient  reports that he does not know how to bring his number up from a 8.  Patient was able to give his interpretation of the term calming down.  Patient reports that he calms down after he hits or yells at the person.  Patient aware of his trigger points.  Patient listened as Therapist explained calming down.  Patient participated in the activity.     Suicidal/Homicidal: No  Therapist Response: Therapist asked consumer how we can bring that number up.  Therapist explored with consumer how he can bring that number up.  Therapist asked consumer what makes them mad/upset/angry.  Therapist explored with consumer ways that they can calm down before getting into a physical or verbal altercation.  Therapist asked consumer what the term "calming down" means.  Therapist asked consumer if he feels he is good at calming down when upset.  Therapist informed consumer that if he understands his trigger points then they can calm down quicker.  Therapist introduced an activity to assist with remaining calm in given situations.  Therapist explained that if you can count to ten, you can calm yourself down.  When something happens to make you tense, you might feel very angry or upset right away.  If you count to ten first, you can calm yourself down before you blow up.  Therapist modeled counting to ten with consumer several times before he was able to follow along.   Therapist role played with consumer in what type of situations these can be useful in.     Plan: Return again in 2 weeks.  Diagnosis: Axis I: Major Depression, Recurrent severe    Axis II: No diagnosis    Lubertha South, LCSW 11/22/2016

## 2016-12-26 NOTE — Progress Notes (Signed)
   THERAPIST PROGRESS NOTE  Session Time: 49min  Participation Level: Active  Behavioral Response: Neat and Well GroomedAlertEuthymic  Type of Therapy: Family Therapy  Treatment Goals addressed: Coping  Interventions: CBT, Motivational Interviewing and Solution Focused  Summary: Brett Vance. is a 61 y.o. male who presents with continued symptoms of his diagnosis. Patient was open and interactive throughout the session.  Patient was able to discuss his thoughts and feelings about his wife leaving the family home.  He reports being unsure about her return.  He reports that he has been able to focus on cleaning his garage and throwing away things that add no value to his life.  He was able to discuss his feelings about his church family and how he has attempted to bring resolution to the situation. Suicidal/Homicidal: No  Therapist Response: LCSW provided Patient with ongoing emotional support and encouragement.  Normalized his feelings.  Commended Patient on his progress and reinforced the importance of client staying focused on his own strengths and resources and resiliency. Processed various strategies for dealing with stressors.      Plan: Return again in 2 weeks.  Diagnosis: Axis I: Major Depression, Recurrent severe    Axis II: No diagnosis    Lubertha South, LCSW 12/20/2016

## 2017-01-04 ENCOUNTER — Emergency Department
Admission: EM | Admit: 2017-01-04 | Discharge: 2017-01-04 | Disposition: A | Discharge status: Home / Self Care | Payer: BLUE CROSS/BLUE SHIELD | Attending: Emergency Medicine | Admitting: Emergency Medicine

## 2017-01-04 ENCOUNTER — Encounter: Payer: Self-pay | Admitting: Emergency Medicine

## 2017-01-04 ENCOUNTER — Emergency Department: Payer: BLUE CROSS/BLUE SHIELD

## 2017-01-04 DIAGNOSIS — M542 Cervicalgia: Secondary | ICD-10-CM | POA: Diagnosis not present

## 2017-01-04 DIAGNOSIS — Y998 Other external cause status: Secondary | ICD-10-CM | POA: Diagnosis not present

## 2017-01-04 DIAGNOSIS — I10 Essential (primary) hypertension: Secondary | ICD-10-CM | POA: Insufficient documentation

## 2017-01-04 DIAGNOSIS — S0990XA Unspecified injury of head, initial encounter: Secondary | ICD-10-CM | POA: Diagnosis present

## 2017-01-04 DIAGNOSIS — Y9389 Activity, other specified: Secondary | ICD-10-CM | POA: Diagnosis not present

## 2017-01-04 DIAGNOSIS — Z79899 Other long term (current) drug therapy: Secondary | ICD-10-CM | POA: Insufficient documentation

## 2017-01-04 DIAGNOSIS — Y92410 Unspecified street and highway as the place of occurrence of the external cause: Secondary | ICD-10-CM | POA: Diagnosis not present

## 2017-01-04 DIAGNOSIS — F329 Major depressive disorder, single episode, unspecified: Secondary | ICD-10-CM | POA: Diagnosis not present

## 2017-01-04 MED ORDER — CYCLOBENZAPRINE HCL 5 MG PO TABS: 5.0000 mg | ORAL_TABLET | Freq: Three times a day (TID) | ORAL | 0 refills | Status: AC | PRN
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
## 2017-01-10 ENCOUNTER — Ambulatory Visit (INDEPENDENT_AMBULATORY_CARE_PROVIDER_SITE_OTHER): Payer: BLUE CROSS/BLUE SHIELD | Admitting: Licensed Clinical Social Worker

## 2017-01-10 DIAGNOSIS — F332 Major depressive disorder, recurrent severe without psychotic features: Secondary | ICD-10-CM
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
## 2017-01-24 ENCOUNTER — Ambulatory Visit (INDEPENDENT_AMBULATORY_CARE_PROVIDER_SITE_OTHER): Payer: BLUE CROSS/BLUE SHIELD | Admitting: Licensed Clinical Social Worker

## 2017-01-24 DIAGNOSIS — F332 Major depressive disorder, recurrent severe without psychotic features: Secondary | ICD-10-CM | POA: Diagnosis not present

## 2017-01-29 NOTE — Progress Notes (Signed)
   THERAPIST PROGRESS NOTE  Session Time: 39min  Participation Level: Active  Behavioral Response: Neat and Well GroomedAlertEuthymic  Type of Therapy: Family Therapy  Treatment Goals addressed: Coping  Interventions: CBT, Motivational Interviewing and Solution Focused  Summary: Brett Vance. is a 61 y.o. male who presents with continued symptoms of his diagnosis. Patient reports that he has a hard time getting negative thoughts off of his mind.  He reports that he delves on the negative.  He reports that he has made progress with organzing his garage and some of his thoughts but continues to struggle.     Suicidal/Homicidal: No  Therapist Response:  Therapist used positive regard and supportive talk to help pt feel comfortable and to build rapport. CBT skills were used to help challenge irrational and paranoid thoughts and redirect patient. Pt was thankful and stated that she just needs someone to validate her because she gets confused and is worried about declining because of his diagnosis and previous experience with hospitalizations.     Plan: Return again in 2 weeks.  Diagnosis: Axis I: Major Depression, Recurrent severe    Axis II: No diagnosis    Lubertha South, LCSW 01/24/2017

## 2017-02-14 ENCOUNTER — Ambulatory Visit: Payer: BLUE CROSS/BLUE SHIELD | Admitting: Licensed Clinical Social Worker

## 2017-05-07 ENCOUNTER — Telehealth: Payer: Self-pay | Admitting: Urology

## 2017-05-07 DIAGNOSIS — E291 Testicular hypofunction: Secondary | ICD-10-CM

## 2017-05-07 NOTE — Telephone Encounter (Signed)
Pt is following Stoioff from [facility].  He said he normally has Testosterone checked every 6 months.  The last time he saw Stoioff at [facility] was June 2018.  I scheduled a lab for this Thursday 2/7 for testosterone.  (please put order in).  Also, I scheduled pt an appt w/Stoioff in June.  Pt wants to know if he needs a repeat PSA before that appt.  Please advise.

## 2017-05-08 NOTE — Telephone Encounter (Signed)
Orders placed.

## 2017-05-09 ENCOUNTER — Telehealth: Payer: Self-pay | Admitting: Urology

## 2017-05-09 ENCOUNTER — Other Ambulatory Visit: Payer: Self-pay

## 2017-05-09 DIAGNOSIS — E291 Testicular hypofunction: Secondary | ICD-10-CM

## 2017-05-09 NOTE — Telephone Encounter (Signed)
Pt would like for testosterone to be called in to [facility].  He came in for labs today.

## 2017-05-09 NOTE — Telephone Encounter (Signed)
Pharmacy has been put in the computer.

## 2017-05-10 LAB — TESTOSTERONE: Testosterone: 798 ng/dL (ref 264–916)

## 2017-05-10 MED ORDER — TESTOSTERONE CYPIONATE 200 MG/ML IM SOLN: 100.0000 mg | INTRAMUSCULAR | 0 refills | Status: DC

## 2017-05-10 NOTE — Telephone Encounter (Signed)
Testosterone level stable at 798.  Testosterone was refilled.  Due for follow-up June 2019.

## 2017-05-13 NOTE — Telephone Encounter (Signed)
No answer. Letter sent.

## 2017-09-04 ENCOUNTER — Ambulatory Visit: Payer: Self-pay | Admitting: Urology

## 2017-10-11 ENCOUNTER — Encounter: Payer: Self-pay | Admitting: *Deleted

## 2017-10-14 ENCOUNTER — Ambulatory Visit: Payer: BLUE CROSS/BLUE SHIELD | Admitting: Anesthesiology

## 2017-10-14 ENCOUNTER — Encounter
Admission: RE | Disposition: A | Discharge status: Home / Self Care | Payer: Self-pay | Source: Ambulatory Visit | Attending: Unknown Physician Specialty

## 2017-10-14 ENCOUNTER — Ambulatory Visit
Admission: RE | Admit: 2017-10-14 | Discharge: 2017-10-14 | Disposition: A | Discharge status: Home / Self Care | Payer: BLUE CROSS/BLUE SHIELD | Source: Ambulatory Visit | Attending: Unknown Physician Specialty | Admitting: Unknown Physician Specialty

## 2017-10-14 ENCOUNTER — Encounter: Payer: Self-pay | Admitting: Anesthesiology

## 2017-10-14 DIAGNOSIS — Z1211 Encounter for screening for malignant neoplasm of colon: Secondary | ICD-10-CM | POA: Diagnosis not present

## 2017-10-14 DIAGNOSIS — I10 Essential (primary) hypertension: Secondary | ICD-10-CM | POA: Insufficient documentation

## 2017-10-14 DIAGNOSIS — D122 Benign neoplasm of ascending colon: Secondary | ICD-10-CM | POA: Diagnosis not present

## 2017-10-14 DIAGNOSIS — F329 Major depressive disorder, single episode, unspecified: Secondary | ICD-10-CM | POA: Insufficient documentation

## 2017-10-14 DIAGNOSIS — Z7982 Long term (current) use of aspirin: Secondary | ICD-10-CM | POA: Diagnosis not present

## 2017-10-14 DIAGNOSIS — K640 First degree hemorrhoids: Secondary | ICD-10-CM | POA: Diagnosis not present

## 2017-10-14 DIAGNOSIS — Z8249 Family history of ischemic heart disease and other diseases of the circulatory system: Secondary | ICD-10-CM | POA: Diagnosis not present

## 2017-10-14 DIAGNOSIS — Z809 Family history of malignant neoplasm, unspecified: Secondary | ICD-10-CM | POA: Insufficient documentation

## 2017-10-14 DIAGNOSIS — G4730 Sleep apnea, unspecified: Secondary | ICD-10-CM | POA: Diagnosis not present

## 2017-10-14 DIAGNOSIS — D123 Benign neoplasm of transverse colon: Secondary | ICD-10-CM | POA: Insufficient documentation

## 2017-10-14 DIAGNOSIS — Z800 Family history of malignant neoplasm of digestive organs: Secondary | ICD-10-CM | POA: Diagnosis not present

## 2017-10-14 DIAGNOSIS — Z79899 Other long term (current) drug therapy: Secondary | ICD-10-CM | POA: Diagnosis not present

## 2017-10-14 DIAGNOSIS — M1990 Unspecified osteoarthritis, unspecified site: Secondary | ICD-10-CM | POA: Insufficient documentation

## 2017-10-14 HISTORY — DX: Benign prostatic hyperplasia without lower urinary tract symptoms: N40.0

## 2017-10-14 HISTORY — PX: COLONOSCOPY WITH PROPOFOL: SHX5780

## 2017-10-14 HISTORY — DX: Personal history of urinary calculi: Z87.442

## 2017-10-14 HISTORY — DX: Other specified behavioral and emotional disorders with onset usually occurring in childhood and adolescence: F98.8

## 2017-10-14 HISTORY — DX: Sleep apnea, unspecified: G47.30

## 2017-10-14 HISTORY — DX: Major depressive disorder, single episode, unspecified: F32.9

## 2017-10-14 HISTORY — DX: Calculus of kidney: N20.0

## 2017-10-14 HISTORY — DX: Venous insufficiency (chronic) (peripheral): I87.2

## 2017-10-14 HISTORY — DX: Unspecified osteoarthritis, unspecified site: M19.90

## 2017-10-14 HISTORY — DX: Depression, unspecified: F32.A

## 2017-10-14 HISTORY — DX: Cardiomegaly: I51.7

## 2017-10-14 HISTORY — DX: Endocrine disorder, unspecified: E34.9

## 2017-10-14 SURGERY — COLONOSCOPY WITH PROPOFOL
Anesthesia: General

## 2017-10-14 MED ORDER — SODIUM CHLORIDE 0.9 % IV SOLN
INTRAVENOUS | Status: DC
  Administered 2017-10-14: 14:00:00 via INTRAVENOUS

## 2017-10-14 MED ORDER — PROPOFOL 10 MG/ML IV BOLUS
INTRAVENOUS | Status: DC | PRN
  Administered 2017-10-14: 90 mg via INTRAVENOUS

## 2017-10-14 MED ORDER — PIPERACILLIN-TAZOBACTAM 3.375 G IVPB
INTRAVENOUS | Status: AC
  Administered 2017-10-14: 3.375 g
  Filled 2017-10-14: qty 50

## 2017-10-14 MED ORDER — FENTANYL CITRATE (PF) 100 MCG/2ML IJ SOLN
INTRAMUSCULAR | Status: DC | PRN
  Administered 2017-10-14 (×2): 50 ug via INTRAVENOUS

## 2017-10-14 MED ORDER — GLYCOPYRROLATE 0.2 MG/ML IJ SOLN
INTRAMUSCULAR | Status: AC
  Filled 2017-10-14: qty 1

## 2017-10-14 MED ORDER — PROPOFOL 10 MG/ML IV BOLUS
INTRAVENOUS | Status: AC
  Filled 2017-10-14: qty 20

## 2017-10-14 MED ORDER — FENTANYL CITRATE (PF) 100 MCG/2ML IJ SOLN
INTRAMUSCULAR | Status: AC
  Filled 2017-10-14: qty 2

## 2017-10-14 MED ORDER — PIPERACILLIN-TAZOBACTAM 3.375 G IVPB 30 MIN
3.3750 g | Freq: Once | INTRAVENOUS | Status: DC
  Filled 2017-10-14: qty 50

## 2017-10-14 MED ORDER — PROPOFOL 500 MG/50ML IV EMUL
INTRAVENOUS | Status: DC | PRN
  Administered 2017-10-14: 120 ug/kg/min via INTRAVENOUS

## 2017-10-14 MED ORDER — GLYCOPYRROLATE 0.2 MG/ML IJ SOLN
INTRAMUSCULAR | Status: DC | PRN
  Administered 2017-10-14: 0.2 mg via INTRAVENOUS

## 2017-10-14 MED ORDER — SODIUM CHLORIDE 0.9 % IV SOLN: INTRAVENOUS | Status: DC

## 2017-10-14 NOTE — H&P (Signed)
Primary Care Physician:  Idelle Crouch, MD Primary Gastroenterologist:  Dr. Vira Agar  Pre-Procedure History & Physical: HPI:  Brett Pyo. is a 62 y.o. male is here for an colonoscopy.  Done for family history colon cancer in his mother.   Past Medical History:  Diagnosis Date  . ADD (attention deficit disorder)   . Arthritis   . BPH (benign prostatic hyperplasia)   . Depression   . History of kidney stones   . Hypertension   . Kidney stone   . LVH (left ventricular hypertrophy) 02/12/2014   due to hypertensive disease, without heart failure  . Nephrolithiasis   . Sleep apnea    CPAP  . Sleep related gastroesophageal reflux disease   . Testosterone deficiency   . Venous insufficiency of both lower extremities 09/25/2016    Past Surgical History:  Procedure Laterality Date  . APPENDECTOMY     2009  . athroplasty on left hand Left 3 years ago  . HERNIA REPAIR  2009   Laparascopic incisional/umbilical/ventral hernia repair  . JOINT REPLACEMENT Left 05/2013  . LITHOTRIPSY  2003    Prior to Admission medications   Medication Sig Start Date End Date Taking? Authorizing Provider  ALPRAZolam Duanne Moron) 0.25 MG tablet Take 0.25 mg by mouth as needed for anxiety.   Yes [provider]  amLODipine (NORVASC) 5 MG tablet Take 5 mg by mouth daily.   Yes [provider]  amphetamine-dextroamphetamine (ADDERALL XR) 30 MG 24 hr capsule Take 30 mg by mouth daily.   Yes [provider]  ARIPiprazole (ABILIFY) 10 MG tablet Take 10 mg by mouth daily.   Yes [provider]  aspirin EC 81 MG tablet Take 81 mg by mouth daily.   Yes [provider]  losartan (COZAAR) 100 MG tablet Take 100 mg by mouth daily.   Yes [provider]  Multiple Vitamin (MULTIVITAMIN) tablet Take 1 tablet by mouth daily.   Yes [provider]  ranitidine (ZANTAC) 300 MG tablet Take 300 mg by mouth daily.   Yes [provider]   sertraline (ZOLOFT) 50 MG tablet Take 1 tablet (50 mg total) by mouth daily. 06/29/16  Yes Ravi, Himabindu, MD  sildenafil (REVATIO) 20 MG tablet 20 mg. Take 2-5 tablets by mouth continuously as needed   Yes [provider]  tamsulosin (FLOMAX) 0.4 MG CAPS capsule Take 0.4 mg by mouth. Take once daily 30 minutes after the same meal each day   Yes [provider]  testosterone cypionate (DEPOTESTOSTERONE CYPIONATE) 200 MG/ML injection Inject 0.5 mLs (100 mg total) into the muscle once a week. 05/10/17  Yes Stoioff, Ronda Fairly, MD  traMADol (ULTRAM) 50 MG tablet Take 50 mg by mouth every 6 (six) hours as needed.   Yes [provider]  traZODone (DESYREL) 50 MG tablet Take 1 tablet (50 mg total) by mouth at bedtime as needed for sleep. 06/29/16  Yes Ravi, Himabindu, MD  valsartan (DIOVAN) 160 MG tablet Take 160 mg by mouth 2 (two) times daily.    [provider]    Allergies as of 07/24/2017  . (No Known Allergies)    Family History  Problem Relation Age of Onset  . Cancer Mother   . Cancer Father   . Heart disease Father     Social History   Socioeconomic History  . Marital status: Married    Spouse name: Not on file  . Number of children: Not on file  .  Years of education: Not on file  . Highest education level: Not on file  Occupational History  . Not on file  Social Needs  . Financial resource strain: Not on file  . Food insecurity:    Worry: Not on file    Inability: Not on file  . Transportation needs:    Medical: Not on file    Non-medical: Not on file  Tobacco Use  . Smoking status: Never Smoker  . Smokeless tobacco: Never Used  Substance and Sexual Activity  . Alcohol use: No  . Drug use: No  . Sexual activity: Never  Lifestyle  . Physical activity:    Days per week: Not on file    Minutes per session: Not on file  . Stress: Not on file  Relationships  . Social connections:    Talks on phone: Not on file    Gets together: Not  on file    Attends religious service: Not on file    Active member of club or organization: Not on file    Attends meetings of clubs or organizations: Not on file    Relationship status: Not on file  . Intimate partner violence:    Fear of current or ex partner: Not on file    Emotionally abused: Not on file    Physically abused: Not on file    Forced sexual activity: Not on file  Other Topics Concern  . Not on file  Social History Narrative  . Not on file    Review of Systems: See HPI, otherwise negative ROS  Physical Exam: BP (!) 149/87   Pulse 73   Temp (!) 97.1 F (36.2 C) (Tympanic)   Resp 18   Ht 5\' 10"  (1.778 m)   Wt 77.1 kg (170 lb)   SpO2 100%   BMI 24.39 kg/m  General:   Alert,  pleasant and cooperative in NAD Head:  Normocephalic and atraumatic. Neck:  Supple; no masses or thyromegaly. Lungs:  Clear throughout to auscultation.    Heart:  Regular rate and rhythm. Abdomen:  Soft, nontender and nondistended. Normal bowel sounds, without guarding, and without rebound.   Neurologic:  Alert and  oriented x4;  grossly normal neurologically.  Impression/Plan: Brett Pyo. is here for an colonoscopy to be performed for FH colon cancer in Mother  Risks, benefits, limitations, and alternatives regarding  colonoscopy have been reviewed with the patient.  Questions have been answered.  All parties agreeable.   Gaylyn Cheers, MD  10/14/2017, 3:30 PM

## 2017-10-14 NOTE — Anesthesia Postprocedure Evaluation (Signed)
Anesthesia Post Note  Patient: Brett Vance.  Procedure(s) Performed: COLONOSCOPY WITH PROPOFOL (N/A )  Patient location during evaluation: Endoscopy Anesthesia Type: General Level of consciousness: awake and alert Pain management: pain level controlled Vital Signs Assessment: post-procedure vital signs reviewed and stable Respiratory status: spontaneous breathing, nonlabored ventilation, respiratory function stable and patient connected to nasal cannula oxygen Cardiovascular status: blood pressure returned to baseline and stable Postop Assessment: no apparent nausea or vomiting Anesthetic complications: no     Last Vitals:  Vitals:   10/14/17 1610 10/14/17 1620  BP: 121/69   Pulse:    Resp:  16  Temp: (!) 36.2 C   SpO2:      Last Pain:  Vitals:   10/14/17 1630  TempSrc:   PainSc: 0-No pain                 Precious Haws Piscitello

## 2017-10-14 NOTE — Anesthesia Post-op Follow-up Note (Signed)
Anesthesia QCDR form completed.        

## 2017-10-14 NOTE — Transfer of Care (Signed)
Immediate Anesthesia Transfer of Care Note  Patient: Brett Vance.  Procedure(s) Performed: COLONOSCOPY WITH PROPOFOL (N/A )  Patient Location: Endoscopy Unit  Anesthesia Type:General  Level of Consciousness: awake, alert  and oriented  Airway & Oxygen Therapy: Patient Spontanous Breathing  Post-op Assessment: Report given to RN and Post -op Vital signs reviewed and stable  Post vital signs: Reviewed and stable  Last Vitals:  Vitals Value Taken Time  BP 121/69 10/14/2017  4:11 PM  Temp 36.2 C 10/14/2017  4:10 PM  Pulse 77 10/14/2017  4:13 PM  Resp 16 10/14/2017  4:13 PM  SpO2 100 % 10/14/2017  4:13 PM  Vitals shown include unvalidated device data.  Last Pain:  Vitals:   10/14/17 1610  TempSrc: Tympanic  PainSc: 0-No pain      Patients Stated Pain Goal: 0 (51/98/24 2998)  Complications: No apparent anesthesia complications

## 2017-10-14 NOTE — Anesthesia Preprocedure Evaluation (Signed)
Anesthesia Evaluation  Patient identified by MRN, date of birth, ID band Patient awake    Reviewed: Allergy & Precautions, NPO status , Patient's Chart, lab work & pertinent test results, reviewed documented beta blocker date and time   Airway Mallampati: II  TM Distance: >3 FB     Dental  (+) Chipped   Pulmonary sleep apnea ,           Cardiovascular hypertension, Pt. on medications + Peripheral Vascular Disease       Neuro/Psych PSYCHIATRIC DISORDERS Depression    GI/Hepatic GERD  ,  Endo/Other    Renal/GU Renal disease     Musculoskeletal  (+) Arthritis ,   Abdominal   Peds  Hematology   Anesthesia Other Findings ADD.  Reproductive/Obstetrics                             Anesthesia Physical Anesthesia Plan  ASA: III  Anesthesia Plan: General   Post-op Pain Management:    Induction: Intravenous  PONV Risk Score and Plan:   Airway Management Planned:   Additional Equipment:   Intra-op Plan:   Post-operative Plan:   Informed Consent: I have reviewed the patients History and Physical, chart, labs and discussed the procedure including the risks, benefits and alternatives for the proposed anesthesia with the patient or authorized representative who has indicated his/her understanding and acceptance.     Plan Discussed with: CRNA  Anesthesia Plan Comments:         Anesthesia Quick Evaluation

## 2017-10-14 NOTE — Op Note (Signed)
[facility] Patient Name: Brett Vance Procedure Date: 10/14/2017 3:30 PM MRN: [id number] Account #: [id number] Date of Birth: 12-23-1955 Admit Type: Outpatient Age: 62 Room: [facility] ENDO ROOM 3 Gender: Male Note Status: Finalized Procedure:            Colonoscopy Indications:          Screening in patient at increased risk: Family history                        of 1st-degree relative with colorectal cancer Providers:            Manya Silvas, MD Referring MD:         Leonie Douglas. Doy Hutching, MD (Referring MD) Medicines:            Propofol per Anesthesia Complications:        No immediate complications. Procedure:            Pre-Anesthesia Assessment:                       - After reviewing the risks and benefits, the patient                        was deemed in satisfactory condition to undergo the                        procedure.                       After obtaining informed consent, the colonoscope was                        passed under direct vision. Throughout the procedure,                        the patient's blood pressure, pulse, and oxygen                        saturations were monitored continuously. The                        Colonoscope was introduced through the anus and                        advanced to the the cecum, identified by appendiceal                        orifice and ileocecal valve. The colonoscopy was                        somewhat difficult due to a tortuous colon. The patient                        tolerated the procedure well. The quality of the bowel                        preparation was good. Findings:      Two sessile polyps were found in the ascending colon. The polyps were       small in size. These polyps were removed with a hot snare. Resection and       retrieval were complete.  A small polyp was found in the transverse colon. The polyp was sessile.       The polyp was removed with a jumbo cold  forceps. Resection and retrieval       were complete.      Many small-mouthed diverticula were found in the sigmoid colon,       descending colon and transverse colon.      Internal hemorrhoids were found during endoscopy. The hemorrhoids were       mild, small and Grade I (internal hemorrhoids that do not prolapse).      The exam was otherwise without abnormality. Impression:           - Two small polyps in the ascending colon, removed with                        a hot snare. Resected and retrieved.                       - One small polyp in the transverse colon, removed with                        a jumbo cold forceps. Resected and retrieved.                       - Diverticulosis in the sigmoid colon, in the                        descending colon and in the transverse colon.                       - Internal hemorrhoids.                       - The examination was otherwise normal. Recommendation:       - Await pathology results. Manya Silvas, MD 10/14/2017 4:08:02 PM This report has been signed electronically. Number of Addenda: 0 Note Initiated On: 10/14/2017 3:30 PM Scope Withdrawal Time: 0 hours 15 minutes 34 seconds  Total Procedure Duration: 0 hours 26 minutes 4 seconds       [facility]

## 2017-10-15 ENCOUNTER — Encounter: Payer: Self-pay | Admitting: Unknown Physician Specialty

## 2017-10-16 LAB — SURGICAL PATHOLOGY

## 2017-10-17 ENCOUNTER — Telehealth: Payer: Self-pay | Admitting: Urology

## 2017-10-17 NOTE — Telephone Encounter (Signed)
Pt is completely out of Flomax.  He will also be out of Testosterone before his appt.  Please give pt a call [phone]

## 2017-10-18 MED ORDER — TAMSULOSIN HCL 0.4 MG PO CAPS: 0.4000 mg | ORAL_CAPSULE | Freq: Every day | ORAL | 0 refills | Status: DC

## 2017-10-18 MED ORDER — TESTOSTERONE CYPIONATE 200 MG/ML IM SOLN: 100.0000 mg | INTRAMUSCULAR | 0 refills | Status: DC

## 2017-10-18 NOTE — Telephone Encounter (Signed)
Both rx sent to pharmacy

## 2017-11-14 ENCOUNTER — Other Ambulatory Visit: Payer: Self-pay | Admitting: Family Medicine

## 2017-11-14 MED ORDER — TAMSULOSIN HCL 0.4 MG PO CAPS: 0.4000 mg | ORAL_CAPSULE | Freq: Every day | ORAL | 3 refills | Status: DC

## 2017-11-20 ENCOUNTER — Encounter: Payer: Self-pay | Admitting: Urology

## 2017-11-20 ENCOUNTER — Ambulatory Visit (INDEPENDENT_AMBULATORY_CARE_PROVIDER_SITE_OTHER): Payer: BLUE CROSS/BLUE SHIELD | Admitting: Urology

## 2017-11-20 VITALS — BP 148/64 | HR 75 | Ht 68.0 in | Wt 187.1 lb

## 2017-11-20 DIAGNOSIS — E291 Testicular hypofunction: Secondary | ICD-10-CM | POA: Diagnosis not present

## 2017-11-20 DIAGNOSIS — N401 Enlarged prostate with lower urinary tract symptoms: Secondary | ICD-10-CM

## 2017-11-20 DIAGNOSIS — R3912 Poor urinary stream: Secondary | ICD-10-CM

## 2017-11-20 DIAGNOSIS — N5201 Erectile dysfunction due to arterial insufficiency: Secondary | ICD-10-CM

## 2017-11-20 NOTE — Progress Notes (Signed)
11/20/2017 10:21 AM   Brett Vance. 12/25/1955 [id number]  Referring provider: Idelle Crouch, MD [place] [facility] [place], [place] [address]  Chief Complaint  Patient presents with  . Follow-up   Urologic history: 1.  Hypogonadism - on TRT 100 mg testosterone cypionate weekly  2.  Erectile dysfunction - on generic sildenafil prn  3.  BPH with lower urinary tract symptoms -Tamsulosin 0.4 mg daily   HPI: 63 year old male presents for follow-up of the above problems.  I last saw him at [facility] in June 2018.  Since his last visit he has noted worsening lower urinary tract symptoms and complains of urinary hesitancy and intermittency.  He remains on tamsulosin.  Denies dysuria or gross hematuria.  Denies flank, abdominal, pelvic or scrotal pain.  He has 2 days overdue for his testosterone injection.  He has good energy level and libido on TRT.  Generic sildenafil has been effective for his erectile dysfunction.   PMH: Past Medical History:  Diagnosis Date  . ADD (attention deficit disorder)   . Arthritis   . BPH (benign prostatic hyperplasia)   . Depression   . History of kidney stones   . Hypertension   . Kidney stone   . LVH (left ventricular hypertrophy) 02/12/2014   due to hypertensive disease, without heart failure  . Nephrolithiasis   . Sleep apnea    CPAP  . Sleep related gastroesophageal reflux disease   . Testosterone deficiency   . Venous insufficiency of both lower extremities 09/25/2016    Surgical History: Past Surgical History:  Procedure Laterality Date  . APPENDECTOMY     2009  . athroplasty on left hand Left 3 years ago  . COLONOSCOPY WITH PROPOFOL N/A 10/14/2017   Procedure: COLONOSCOPY WITH PROPOFOL;  Surgeon: Manya Silvas, MD;  Location: [facility] ENDOSCOPY;  Service: Endoscopy;  Laterality: N/A;  . HERNIA REPAIR  2009   Laparascopic incisional/umbilical/ventral hernia repair  . JOINT REPLACEMENT Left 05/2013  .  LITHOTRIPSY  2003    Home Medications:  Allergies as of 11/20/2017   No Known Allergies     Medication List        Accurate as of 11/20/17 10:21 AM. Always use your most recent med list.          ALPRAZolam 0.25 MG tablet Commonly known as:  XANAX Take 0.25 mg by mouth as needed for anxiety.   amLODipine 5 MG tablet Commonly known as:  NORVASC Take 5 mg by mouth daily.   amphetamine-dextroamphetamine 30 MG 24 hr capsule Commonly known as:  ADDERALL XR Take 30 mg by mouth daily.   ARIPiprazole 10 MG tablet Commonly known as:  ABILIFY Take 10 mg by mouth daily.   aspirin EC 81 MG tablet Take 81 mg by mouth daily.   losartan 100 MG tablet Commonly known as:  COZAAR Take 100 mg by mouth daily.   multivitamin tablet Take 1 tablet by mouth daily.   omeprazole 40 MG capsule Commonly known as:  PRILOSEC omeprazole 40 mg capsule,delayed release   ranitidine 300 MG tablet Commonly known as:  ZANTAC Take 300 mg by mouth daily.   sertraline 50 MG tablet Commonly known as:  ZOLOFT Take 1 tablet (50 mg total) by mouth daily.   sildenafil 20 MG tablet Commonly known as:  REVATIO 20 mg. Take 2-5 tablets by mouth continuously as needed   tamsulosin 0.4 MG Caps capsule Commonly known as:  FLOMAX Take 1 capsule (0.4  mg total) by mouth daily. Take once daily 30 minutes after the same meal each day   testosterone cypionate 200 MG/ML injection Commonly known as:  DEPOTESTOSTERONE CYPIONATE Inject 0.5 mLs (100 mg total) into the muscle once a week.   traMADol 50 MG tablet Commonly known as:  ULTRAM Take 50 mg by mouth every 6 (six) hours as needed.   traZODone 50 MG tablet Commonly known as:  DESYREL Take 1 tablet (50 mg total) by mouth at bedtime as needed for sleep.   valsartan 160 MG tablet Commonly known as:  DIOVAN Take 160 mg by mouth 2 (two) times daily.   zolpidem 10 MG tablet Commonly known as:  AMBIEN       Allergies: No Known Allergies  Family  History: Family History  Problem Relation Age of Onset  . Cancer Mother   . Cancer Father   . Heart disease Father     Social History:  reports that he has never smoked. He has never used smokeless tobacco. He reports that he does not drink alcohol or use drugs.  ROS: UROLOGY Frequent Urination?: No Hard to postpone urination?: Yes Burning/pain with urination?: No Get up at night to urinate?: Yes Leakage of urine?: No Urine stream starts and stops?: Yes Trouble starting stream?: No Do you have to strain to urinate?: No Blood in urine?: No Urinary tract infection?: No Sexually transmitted disease?: No Injury to kidneys or bladder?: No Painful intercourse?: No Weak stream?: Yes Erection problems?: No Penile pain?: No  Gastrointestinal Nausea?: No Vomiting?: No Indigestion/heartburn?: No Diarrhea?: No Constipation?: No  Constitutional Fever: No Night sweats?: No Weight loss?: No Fatigue?: No  Skin Skin rash/lesions?: No Itching?: No  Eyes Blurred vision?: No Double vision?: No  Ears/Nose/Throat Sore throat?: No Sinus problems?: No  Hematologic/Lymphatic Swollen glands?: No Easy bruising?: No  Cardiovascular Leg swelling?: No Chest pain?: No  Respiratory Cough?: No Shortness of breath?: No  Endocrine Excessive thirst?: No  Musculoskeletal Back pain?: No Joint pain?: No  Neurological Headaches?: No Dizziness?: No  Psychologic Depression?: No Anxiety?: No  Physical Exam: BP (!) 148/64 (BP Location: Left Arm, Patient Position: Sitting, Cuff Size: Normal)   Pulse 75   Ht 5\' 8"  (1.727 m)   Wt 187 lb 1.6 oz (84.9 kg)   BMI 28.45 kg/m   Constitutional:  Alert and oriented, No acute distress. HEENT: [place] AT, moist mucus membranes.  Trachea midline, no masses. Cardiovascular: No clubbing, cyanosis, or edema. Respiratory: Normal respiratory effort, no increased work of breathing. GI: Abdomen is soft, nontender, nondistended, no abdominal  masses GU: No CVA tenderness.  Prostate 40 g, smooth without nodules Lymph: No cervical or inguinal lymphadenopathy. Skin: No rashes, bruises or suspicious lesions. Neurologic: Grossly intact, no focal deficits, moving all 4 extremities. Psychiatric: Normal mood and affect.   Assessment & Plan:    1. Hypogonadism in male Monitoring blood work was drawn.  Testosterone was refilled.  If stable follow-up testosterone level and hematocrit in 6 months and office visit 1 year. - PSA - Testosterone - Hematocrit  2.  Erectile dysfunction Sildenafil was refilled.  3.  BPH with lower urinary tract symptoms He has noted worsening lower urinary tract symptoms in the last year.  Management options were discussed including titrating tamsulosin 0.8 mg, adding a 5-ARI.  Minimally invasive options were also discussed including UroLift and he was given literature.  TURP and PVP were also discussed.  He has initially elected medical management and will increase tamsulosin 0.8  mg.  He will call back in 1 month regarding efficacy.  Return in about 1 year (around 11/21/2018) for Recheck, labs.  Abbie Sons, [place] [address], [place], [place] [address] [phone]

## 2017-11-21 ENCOUNTER — Telehealth: Payer: Self-pay | Admitting: Family Medicine

## 2017-11-21 ENCOUNTER — Encounter: Payer: Self-pay | Admitting: Urology

## 2017-11-21 ENCOUNTER — Other Ambulatory Visit: Payer: Self-pay | Admitting: Family Medicine

## 2017-11-21 DIAGNOSIS — E291 Testicular hypofunction: Secondary | ICD-10-CM | POA: Insufficient documentation

## 2017-11-21 DIAGNOSIS — R3912 Poor urinary stream: Secondary | ICD-10-CM

## 2017-11-21 DIAGNOSIS — N401 Enlarged prostate with lower urinary tract symptoms: Secondary | ICD-10-CM | POA: Insufficient documentation

## 2017-11-21 DIAGNOSIS — N5201 Erectile dysfunction due to arterial insufficiency: Secondary | ICD-10-CM | POA: Insufficient documentation

## 2017-11-21 DIAGNOSIS — R972 Elevated prostate specific antigen [PSA]: Secondary | ICD-10-CM

## 2017-11-21 LAB — TESTOSTERONE: Testosterone: 202 ng/dL — ABNORMAL LOW (ref 264–916)

## 2017-11-21 LAB — HEMATOCRIT: HEMATOCRIT: 38.1 % (ref 37.5–51.0)

## 2017-11-21 LAB — PSA: Prostate Specific Ag, Serum: 3.3 ng/mL (ref 0.0–4.0)

## 2017-11-21 MED ORDER — SILDENAFIL CITRATE 20 MG PO TABS: ORAL_TABLET | ORAL | 3 refills | Status: DC

## 2017-11-21 MED ORDER — TAMSULOSIN HCL 0.4 MG PO CAPS: 0.8000 mg | ORAL_CAPSULE | Freq: Every day | ORAL | 0 refills | Status: DC

## 2017-11-21 MED ORDER — TESTOSTERONE CYPIONATE 200 MG/ML IM SOLN: 100.0000 mg | INTRAMUSCULAR | 0 refills | Status: DC

## 2017-11-21 NOTE — Telephone Encounter (Signed)
-----   Message from Abbie Sons, MD sent at 11/21/2017  8:23 AM EDT ----- Testosterone level was low at 202 however he is past due for an injection.  His testosterone Rx has been sent.  Hematocrit was normal.  His PSA was elevated compared with his PSA last year.  Recommend repeating a PSA in 4-6 weeks.

## 2017-11-21 NOTE — Telephone Encounter (Signed)
Patient notified and is scheduled for Lab, order placed

## 2017-11-29 ENCOUNTER — Other Ambulatory Visit: Payer: Self-pay

## 2017-11-29 DIAGNOSIS — N401 Enlarged prostate with lower urinary tract symptoms: Secondary | ICD-10-CM

## 2017-11-29 DIAGNOSIS — R3912 Poor urinary stream: Principal | ICD-10-CM

## 2017-11-29 MED ORDER — TAMSULOSIN HCL 0.4 MG PO CAPS: 0.8000 mg | ORAL_CAPSULE | Freq: Every day | ORAL | 3 refills | Status: DC

## 2017-12-13 ENCOUNTER — Other Ambulatory Visit: Payer: Self-pay | Admitting: General Surgery

## 2017-12-13 DIAGNOSIS — R1031 Right lower quadrant pain: Secondary | ICD-10-CM

## 2017-12-23 ENCOUNTER — Ambulatory Visit
Admission: RE | Admit: 2017-12-23 | Discharge: 2017-12-23 | Disposition: A | Discharge status: Home / Self Care | Payer: BLUE CROSS/BLUE SHIELD | Source: Ambulatory Visit | Attending: General Surgery | Admitting: General Surgery

## 2017-12-23 DIAGNOSIS — K5790 Diverticulosis of intestine, part unspecified, without perforation or abscess without bleeding: Secondary | ICD-10-CM | POA: Insufficient documentation

## 2017-12-23 DIAGNOSIS — R1031 Right lower quadrant pain: Secondary | ICD-10-CM

## 2017-12-23 DIAGNOSIS — K760 Fatty (change of) liver, not elsewhere classified: Secondary | ICD-10-CM | POA: Insufficient documentation

## 2017-12-23 MED ORDER — IOPAMIDOL (ISOVUE-300) INJECTION 61%
100.0000 mL | Freq: Once | INTRAVENOUS | Status: AC | PRN
  Administered 2017-12-23: 100 mL via INTRAVENOUS

## 2017-12-25 ENCOUNTER — Other Ambulatory Visit: Payer: BLUE CROSS/BLUE SHIELD

## 2017-12-25 DIAGNOSIS — R972 Elevated prostate specific antigen [PSA]: Secondary | ICD-10-CM

## 2017-12-26 ENCOUNTER — Telehealth: Payer: Self-pay | Admitting: Family Medicine

## 2017-12-26 LAB — PSA: Prostate Specific Ag, Serum: 1.8 ng/mL (ref 0.0–4.0)

## 2017-12-26 NOTE — Telephone Encounter (Signed)
-----   Message from Abbie Sons, MD sent at 12/26/2017  7:50 AM EDT ----- Repeat PSA back to baseline at 1.8

## 2017-12-26 NOTE — Telephone Encounter (Signed)
Patient notified and voiced understanding.

## 2018-05-19 ENCOUNTER — Other Ambulatory Visit: Payer: Self-pay

## 2018-05-19 NOTE — Telephone Encounter (Signed)
Ok to refill, patient is due for labs this week

## 2018-05-21 ENCOUNTER — Other Ambulatory Visit: Payer: BLUE CROSS/BLUE SHIELD

## 2018-05-21 ENCOUNTER — Other Ambulatory Visit: Payer: Self-pay

## 2018-05-21 DIAGNOSIS — E291 Testicular hypofunction: Secondary | ICD-10-CM

## 2018-05-22 ENCOUNTER — Telehealth: Payer: Self-pay

## 2018-05-22 LAB — TESTOSTERONE: TESTOSTERONE: 676 ng/dL (ref 264–916)

## 2018-05-22 LAB — HEMOGLOBIN AND HEMATOCRIT, BLOOD
Hematocrit: 40.6 % (ref 37.5–51.0)
Hemoglobin: 13.7 g/dL (ref 13.0–17.7)

## 2018-05-22 MED ORDER — TESTOSTERONE CYPIONATE 200 MG/ML IM SOLN: 100.0000 mg | INTRAMUSCULAR | 0 refills | Status: DC

## 2018-05-22 NOTE — Telephone Encounter (Signed)
Called and advised patient of lab results and reminded him of Aug appt. Patient verbalized understanding and requested refill on Testosterone. Advised patient refill was sent to [facility] today.

## 2018-05-22 NOTE — Telephone Encounter (Signed)
-----   Message from Abbie Sons, MD sent at 05/22/2018  7:50 AM EST ----- Hematocrit looks good at 40.6.  Testosterone level was 676.  Follow-up as scheduled

## 2018-11-07 IMAGING — RF DG ESOPHAGUS
5 series · 17 of 17 positions shown · non-contrast
Comparison: None.

CLINICAL DATA: Dysphagia

EXAM:
ESOPHOGRAM / BARIUM SWALLOW / BARIUM TABLET STUDY
TECHNIQUE: Combined double contrast and single contrast examination performed
using effervescent crystals, thick barium liquid, and thin barium
liquid. The patient was observed with fluoroscopy swallowing a 13 mm
barium sulphate tablet.
FLUOROSCOPY TIME:  Fluoroscopy Time:  0.8 minutes
Radiation Exposure Index (if provided by the fluoroscopic device): 6
mGy
Number of Acquired Spot Images: 0

[Series 1: cp_standard · 0.51mm/px · 4 of 30 frames shown (1 of 5)]
[frame 5/30]
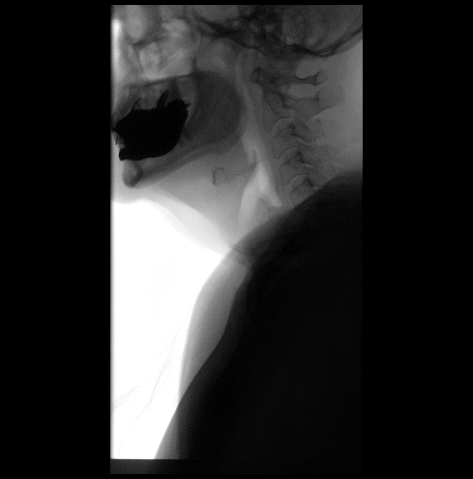
[frame 16/30]
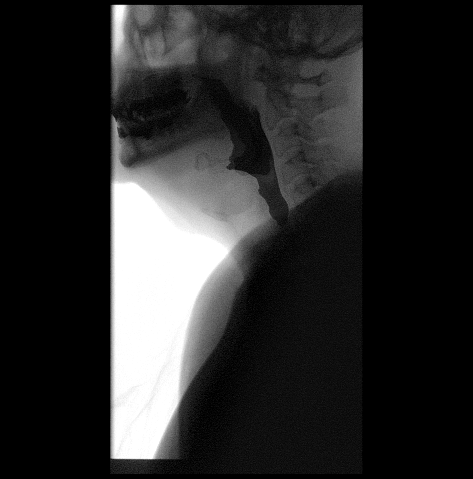
[frame 26/30]
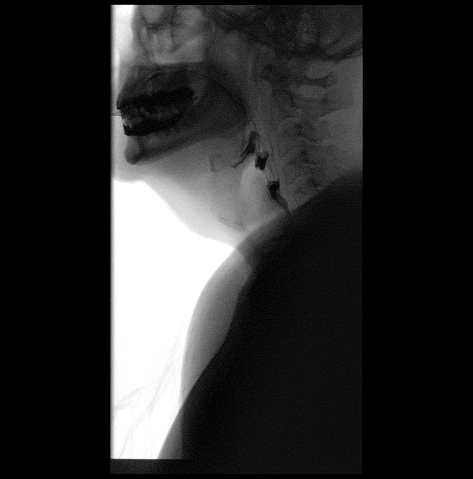
[frame 28/30]
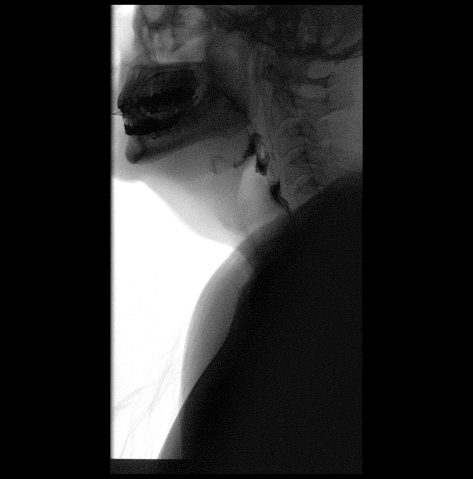

[Series 2: cp_standard · 0.51mm/px · 4 of 52 frames shown (2 of 5)]
[frame 5/52]
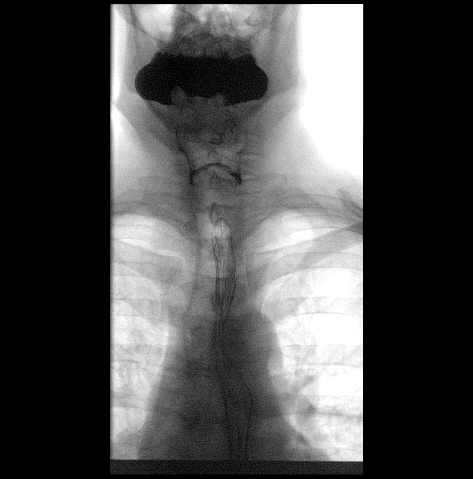
[frame 8/52]
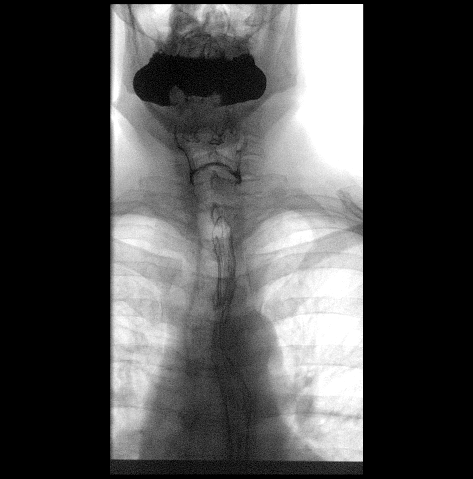
[frame 27/52]
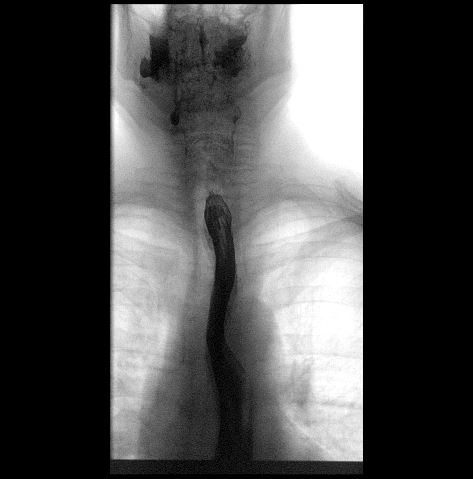
[frame 45/52]
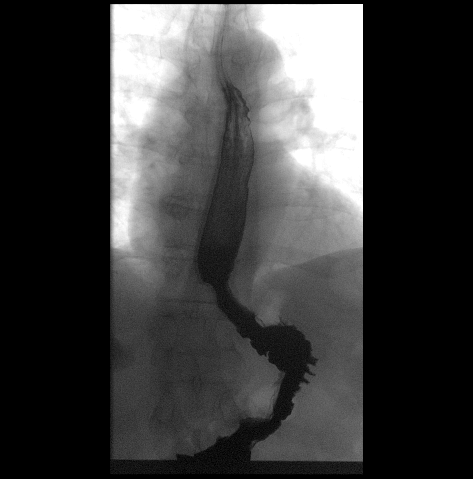

[Series 3: cp_standard · 0.52mm/px · 4 of 44 frames shown (3 of 5)]
[frame 7/44]
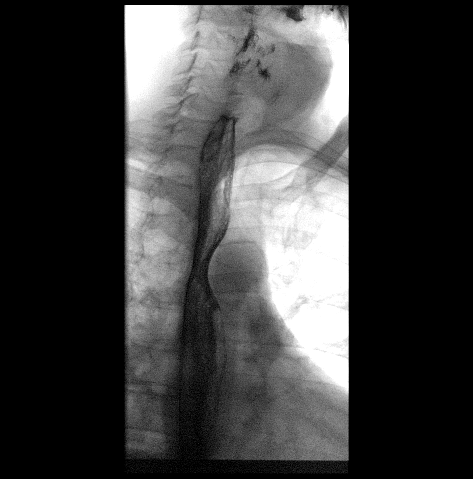
[frame 9/44]
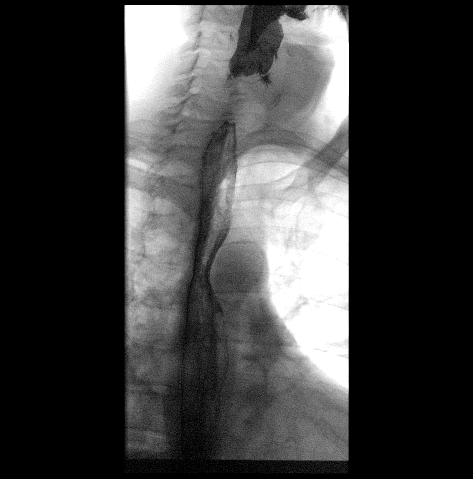
[frame 23/44]
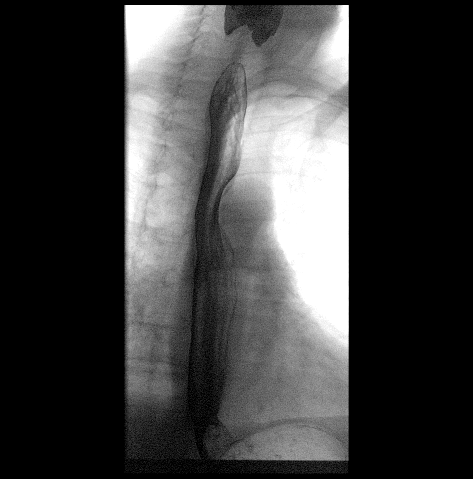
[frame 38/44]
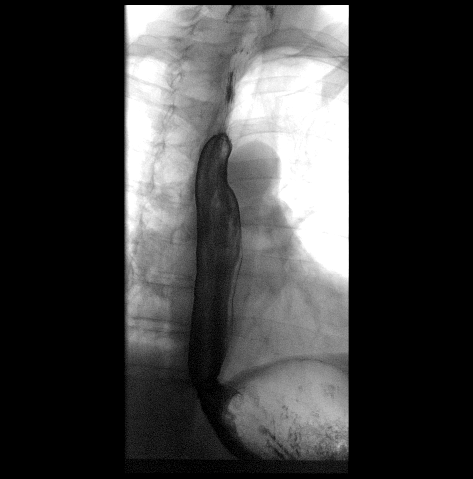

[Series 4: cp_standard · 0.53mm/px · 4 of 45 frames shown (4 of 5)]
[frame 7/45]
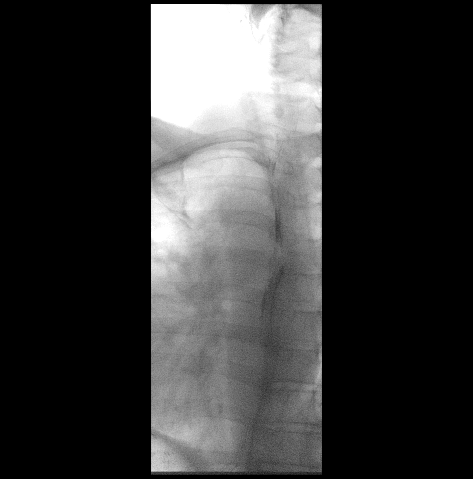
[frame 23/45]
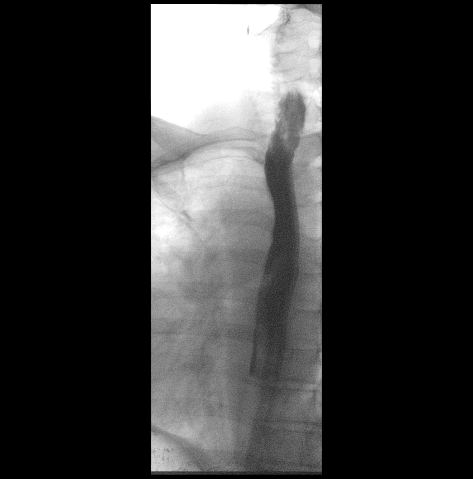
[frame 39/45]
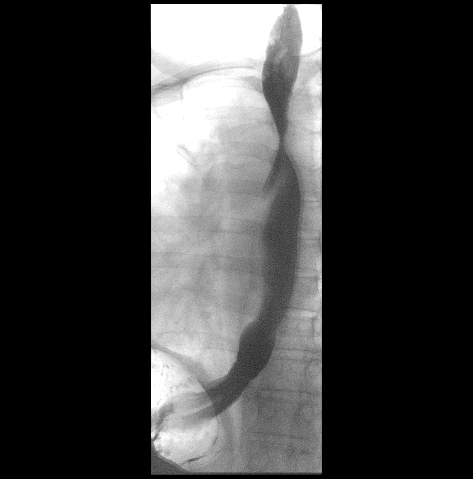
[frame 44/45]
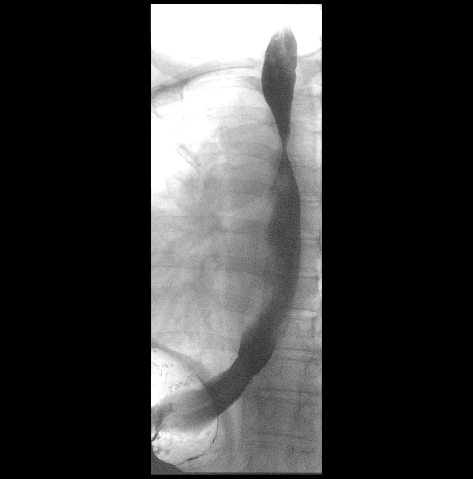

[Series 5: cp_standard · 0.28mm/px · 1 of 1 slices shown (5 of 5)]
[im 1/1]
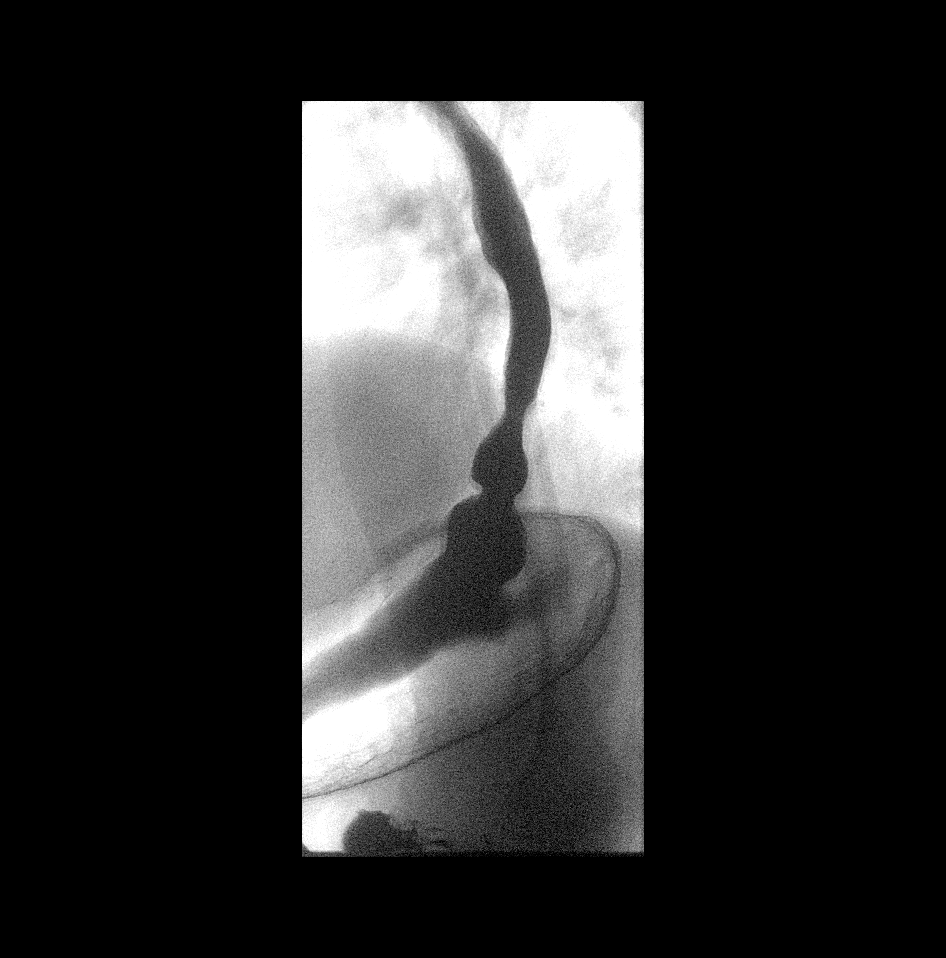

[17 of 17 positions shown; findings below may reference images not displayed]

FINDINGS: There was normal pharyngeal anatomy and motility. Contrast flowed
freely through the esophagus without evidence of a mass. Mild focal
narrowing of the distal esophagus just proximal to the
esophagogastric junction concerning for mild stricture which does
not restrict the passage of a barium tablet. There was normal
esophageal mucosa without evidence of irregularity or ulceration.
Esophageal motility was normal. Severe gastroesophageal reflux. No
definite hiatal hernia was demonstrated.

At the end of the examination a 13 mm barium tablet was administered
which transited through the esophagus and esophagogastric junction
without delay.
IMPRESSION: 1. Mild stricture of the distal esophagus just proximal to the
esophagogastric junction which does not restrict the passage of a
barium tablet.
2. Severe gastroesophageal reflux.

## 2018-11-13 ENCOUNTER — Other Ambulatory Visit: Payer: Self-pay | Admitting: Urology

## 2018-11-17 MED ORDER — TESTOSTERONE CYPIONATE 200 MG/ML IM SOLN: 100.0000 mg | INTRAMUSCULAR | 0 refills | Status: DC

## 2018-11-19 ENCOUNTER — Ambulatory Visit: Payer: Self-pay | Admitting: Urology

## 2018-11-25 ENCOUNTER — Other Ambulatory Visit: Payer: Self-pay

## 2018-11-25 DIAGNOSIS — N401 Enlarged prostate with lower urinary tract symptoms: Secondary | ICD-10-CM

## 2018-11-25 MED ORDER — TAMSULOSIN HCL 0.4 MG PO CAPS: 0.8000 mg | ORAL_CAPSULE | Freq: Every day | ORAL | 3 refills | Status: DC

## 2018-12-02 ENCOUNTER — Other Ambulatory Visit: Payer: Self-pay

## 2018-12-02 ENCOUNTER — Ambulatory Visit (INDEPENDENT_AMBULATORY_CARE_PROVIDER_SITE_OTHER): Payer: BC Managed Care – PPO | Admitting: Urology

## 2018-12-02 ENCOUNTER — Encounter: Payer: Self-pay | Admitting: Urology

## 2018-12-02 VITALS — BP 141/81 | HR 66 | Ht 70.0 in | Wt 185.0 lb

## 2018-12-02 DIAGNOSIS — N401 Enlarged prostate with lower urinary tract symptoms: Secondary | ICD-10-CM | POA: Diagnosis not present

## 2018-12-02 DIAGNOSIS — E291 Testicular hypofunction: Secondary | ICD-10-CM | POA: Diagnosis not present

## 2018-12-02 LAB — BLADDER SCAN AMB NON-IMAGING: Scan Result: 43

## 2018-12-02 MED ORDER — TESTOSTERONE CYPIONATE 200 MG/ML IM SOLN: 100.0000 mg | INTRAMUSCULAR | 0 refills | Status: DC

## 2018-12-02 MED ORDER — SILODOSIN 8 MG PO CAPS: 8.0000 mg | ORAL_CAPSULE | Freq: Every day | ORAL | 0 refills | Status: DC

## 2018-12-02 NOTE — Progress Notes (Signed)
12/02/2018 9:04 AM   Concha Pyo. Dec 25, 1955 [id number]  Referring provider: Idelle Crouch, MD [facility] [place],  [place] [address]  Chief Complaint  Patient presents with  . Follow-up    Urologic history: 1.  Hypogonadism - on TRT 100 mg testosterone cypionate weekly  2.  Erectile dysfunction - on generic sildenafil prn  3.  BPH with lower urinary tract symptoms -Tamsulosin 0.4 mg daily   HPI: 63 y.o. male presents for annual follow-up.  He continues on weekly testosterone cypionate with good energy level and libido.  Sildenafil still effective for erectile dysfunction.  At his visit last year he had noted worsening voiding symptoms and his tamsulosin was increased to 0.8 mg.  He did note improvement in his lower urinary tract symptoms however complains of increased tiredness with this dose increase.  Interim 6 months labs in February remarkable for testosterone of 676 and hematocrit of 40.6.   PMH: Past Medical History:  Diagnosis Date  . ADD (attention deficit disorder)   . Arthritis   . BPH (benign prostatic hyperplasia)   . Depression   . History of kidney stones   . Hypertension   . Kidney stone   . LVH (left ventricular hypertrophy) 02/12/2014   due to hypertensive disease, without heart failure  . Nephrolithiasis   . Sleep apnea    CPAP  . Sleep related gastroesophageal reflux disease   . Testosterone deficiency   . Venous insufficiency of both lower extremities 09/25/2016    Surgical History: Past Surgical History:  Procedure Laterality Date  . APPENDECTOMY     2009  . athroplasty on left hand Left 3 years ago  . COLONOSCOPY WITH PROPOFOL N/A 10/14/2017   Procedure: COLONOSCOPY WITH PROPOFOL;  Surgeon: Manya Silvas, MD;  Location: [facility] ENDOSCOPY;  Service: Endoscopy;  Laterality: N/A;  . HERNIA REPAIR  2009   Laparascopic incisional/umbilical/ventral hernia repair  . JOINT REPLACEMENT Left 05/2013   . LITHOTRIPSY  2003    Home Medications:  Allergies as of 12/02/2018   No Known Allergies     Medication List       Accurate as of December 02, 2018  9:04 AM. If you have any questions, ask your nurse or doctor.        ALPRAZolam 0.25 MG tablet Commonly known as: XANAX Take 0.25 mg by mouth as needed for anxiety.   amLODipine 5 MG tablet Commonly known as: NORVASC Take 5 mg by mouth daily.   amphetamine-dextroamphetamine 30 MG 24 hr capsule Commonly known as: ADDERALL XR Take 30 mg by mouth daily.   ARIPiprazole 10 MG tablet Commonly known as: ABILIFY Take 10 mg by mouth daily.   aspirin EC 81 MG tablet Take 81 mg by mouth daily.   losartan 100 MG tablet Commonly known as: COZAAR Take 100 mg by mouth daily.   multivitamin tablet Take 1 tablet by mouth daily.   omeprazole 40 MG capsule Commonly known as: PRILOSEC omeprazole 40 mg capsule,delayed release   ranitidine 300 MG tablet Commonly known as: ZANTAC Take 300 mg by mouth daily.   sertraline 50 MG tablet Commonly known as: ZOLOFT Take 1 tablet (50 mg total) by mouth daily.   sildenafil 20 MG tablet Commonly known as: REVATIO Take 2-5 tablets by mouth continuously as needed   tamsulosin 0.4 MG Caps capsule Commonly known as: Flomax Take 2 capsules (0.8 mg total) by mouth daily. Take once daily 30 minutes after the  same meal each day   testosterone cypionate 200 MG/ML injection Commonly known as: DEPOTESTOSTERONE CYPIONATE Inject 0.5 mLs (100 mg total) into the muscle once a week.   traMADol 50 MG tablet Commonly known as: ULTRAM Take 50 mg by mouth every 6 (six) hours as needed.   traZODone 50 MG tablet Commonly known as: DESYREL Take 1 tablet (50 mg total) by mouth at bedtime as needed for sleep.   valsartan 160 MG tablet Commonly known as: DIOVAN Take 160 mg by mouth 2 (two) times daily.   zolpidem 10 MG tablet Commonly known as: AMBIEN       Allergies: No Known Allergies   Family History: Family History  Problem Relation Age of Onset  . Cancer Mother   . Cancer Father   . Heart disease Father     Social History:  reports that he has never smoked. He has never used smokeless tobacco. He reports that he does not drink alcohol or use drugs.  ROS: No significant change 11/20/2017 except as noted in the HPI  Physical Exam: There were no vitals taken for this visit.  Constitutional:  Alert and oriented, No acute distress. HEENT: [place] AT, moist mucus membranes.  Trachea midline, no masses. Cardiovascular: No clubbing, cyanosis, or edema. Respiratory: Normal respiratory effort, no increased work of breathing. GI: Abdomen is soft, nontender, nondistended, no abdominal masses GU: No CVA tenderness.  Prostate 40 g, smooth without nodules Lymph: No cervical or inguinal lymphadenopathy. Skin: No rashes, bruises or suspicious lesions. Neurologic: Grossly intact, no focal deficits, moving all 4 extremities. Psychiatric: Normal mood and affect.   Assessment & Plan:    - Hypogonadism in male Stable symptoms on TRT.  Testosterone was refilled.  Monitoring lab work drawn.  Continue annual visit with a interim six-month lab visit for testosterone/hematocrit  - BPH with lower urinary tract symptoms He has noted increased side effects with dose titration of tamsulosin.  Options were discussed including going back to 0.4 mg, a trial of silodosin, adding a 5-ARI.  We also discussed surgical options including minimally invasive options, i.e. UroLift.  He is considering UroLift in the future but for now would like a 30-day trial of silodosin.   Abbie Sons, [place] [address] [place],  [address] [phone]

## 2018-12-03 LAB — HEMATOCRIT: Hematocrit: 40.8 % (ref 37.5–51.0)

## 2018-12-03 LAB — PSA: Prostate Specific Ag, Serum: 1.1 ng/mL (ref 0.0–4.0)

## 2018-12-03 LAB — TESTOSTERONE: Testosterone: 662 ng/dL (ref 264–916)

## 2018-12-10 ENCOUNTER — Telehealth: Payer: Self-pay | Admitting: Family Medicine

## 2018-12-10 NOTE — Telephone Encounter (Signed)
LMOM for patient to return call.

## 2018-12-10 NOTE — Telephone Encounter (Signed)
-----   Message from Abbie Sons, MD sent at 12/09/2018  5:08 PM EDT ----- Testosterone level looks good at 662.  PSA stable 1.1.  Hematocrit normal at 40.8.  Follow-up as scheduled

## 2019-01-20 ENCOUNTER — Other Ambulatory Visit: Payer: Self-pay | Admitting: Urology

## 2019-04-15 ENCOUNTER — Telehealth: Payer: Self-pay | Admitting: Urology

## 2019-04-15 DIAGNOSIS — N5201 Erectile dysfunction due to arterial insufficiency: Secondary | ICD-10-CM

## 2019-04-15 MED ORDER — SILDENAFIL CITRATE 20 MG PO TABS: ORAL_TABLET | ORAL | 6 refills | Status: DC

## 2019-04-15 NOTE — Telephone Encounter (Signed)
Pt requests a refill for Viagra.

## 2019-06-03 ENCOUNTER — Other Ambulatory Visit: Payer: Self-pay

## 2019-06-03 DIAGNOSIS — E291 Testicular hypofunction: Secondary | ICD-10-CM

## 2019-06-04 ENCOUNTER — Other Ambulatory Visit: Payer: Self-pay

## 2019-06-04 ENCOUNTER — Other Ambulatory Visit: Payer: BC Managed Care – PPO

## 2019-06-04 DIAGNOSIS — E291 Testicular hypofunction: Secondary | ICD-10-CM

## 2019-06-05 ENCOUNTER — Telehealth: Payer: Self-pay | Admitting: *Deleted

## 2019-06-05 LAB — HEMATOCRIT: Hematocrit: 43 % (ref 37.5–51.0)

## 2019-06-05 LAB — PSA: Prostate Specific Ag, Serum: 1.6 ng/mL (ref 0.0–4.0)

## 2019-06-05 LAB — TESTOSTERONE: Testosterone: 554 ng/dL (ref 264–916)

## 2019-06-05 NOTE — Telephone Encounter (Signed)
-----   Message from Abbie Sons, MD sent at 06/05/2019  7:58 AM EST ----- Testosterone level looks good at 554.  Hematocrit was normal.  PSA stable 1.6.

## 2019-06-05 NOTE — Telephone Encounter (Signed)
Notified patient as instructed, patient pleased. Discussed follow-up appointments, patient agrees  

## 2019-07-27 ENCOUNTER — Other Ambulatory Visit: Payer: Self-pay | Admitting: Urology

## 2019-11-22 ENCOUNTER — Other Ambulatory Visit: Payer: Self-pay | Admitting: Urology

## 2019-11-22 DIAGNOSIS — R3912 Poor urinary stream: Secondary | ICD-10-CM

## 2019-11-30 ENCOUNTER — Other Ambulatory Visit: Payer: Self-pay

## 2019-11-30 DIAGNOSIS — E291 Testicular hypofunction: Secondary | ICD-10-CM

## 2019-11-30 DIAGNOSIS — N401 Enlarged prostate with lower urinary tract symptoms: Secondary | ICD-10-CM

## 2019-12-01 ENCOUNTER — Other Ambulatory Visit: Payer: Self-pay

## 2019-12-01 ENCOUNTER — Other Ambulatory Visit: Payer: BC Managed Care – PPO

## 2019-12-01 DIAGNOSIS — N401 Enlarged prostate with lower urinary tract symptoms: Secondary | ICD-10-CM

## 2019-12-01 DIAGNOSIS — E291 Testicular hypofunction: Secondary | ICD-10-CM

## 2019-12-02 LAB — PSA: Prostate Specific Ag, Serum: 1.6 ng/mL (ref 0.0–4.0)

## 2019-12-02 LAB — HEMATOCRIT: Hematocrit: 41 % (ref 37.5–51.0)

## 2019-12-02 LAB — TESTOSTERONE: Testosterone: 859 ng/dL (ref 264–916)

## 2019-12-04 ENCOUNTER — Ambulatory Visit: Payer: BC Managed Care – PPO | Admitting: Urology

## 2019-12-09 ENCOUNTER — Ambulatory Visit: Payer: Self-pay | Admitting: Urology

## 2019-12-10 ENCOUNTER — Ambulatory Visit (INDEPENDENT_AMBULATORY_CARE_PROVIDER_SITE_OTHER): Payer: BC Managed Care – PPO | Admitting: Urology

## 2019-12-10 ENCOUNTER — Encounter: Payer: Self-pay | Admitting: Urology

## 2019-12-10 ENCOUNTER — Other Ambulatory Visit: Payer: Self-pay

## 2019-12-10 VITALS — BP 139/71 | HR 66 | Ht 69.0 in | Wt 190.3 lb

## 2019-12-10 DIAGNOSIS — N5201 Erectile dysfunction due to arterial insufficiency: Secondary | ICD-10-CM

## 2019-12-10 DIAGNOSIS — N401 Enlarged prostate with lower urinary tract symptoms: Secondary | ICD-10-CM

## 2019-12-10 DIAGNOSIS — R3912 Poor urinary stream: Secondary | ICD-10-CM | POA: Diagnosis not present

## 2019-12-10 DIAGNOSIS — S43409A Unspecified sprain of unspecified shoulder joint, initial encounter: Secondary | ICD-10-CM | POA: Insufficient documentation

## 2019-12-10 DIAGNOSIS — E291 Testicular hypofunction: Secondary | ICD-10-CM

## 2019-12-10 DIAGNOSIS — M19049 Primary osteoarthritis, unspecified hand: Secondary | ICD-10-CM | POA: Insufficient documentation

## 2019-12-10 DIAGNOSIS — M25519 Pain in unspecified shoulder: Secondary | ICD-10-CM | POA: Insufficient documentation

## 2019-12-10 DIAGNOSIS — M719 Bursopathy, unspecified: Secondary | ICD-10-CM | POA: Insufficient documentation

## 2019-12-10 LAB — BLADDER SCAN AMB NON-IMAGING

## 2019-12-10 MED ORDER — TAMSULOSIN HCL 0.4 MG PO CAPS: 0.8000 mg | ORAL_CAPSULE | Freq: Every day | ORAL | 3 refills | Status: DC

## 2019-12-10 MED ORDER — TESTOSTERONE CYPIONATE 200 MG/ML IM SOLN
INTRAMUSCULAR | 0 refills | Status: DC
Start: 2019-12-10 — End: 2020-08-18

## 2019-12-10 MED ORDER — SILDENAFIL CITRATE 20 MG PO TABS: ORAL_TABLET | ORAL | 6 refills | Status: DC

## 2019-12-10 NOTE — Progress Notes (Signed)
12/10/2019 11:28 AM   Brett Vance. 04/08/1955 [id number]  Referring provider: Idelle Crouch, MD [facility] [place],  [place] [address]  Chief Complaint  Patient presents with  . Follow-up    Urologic history: 1.Hypogonadism - on TRT 100 mg testosterone cypionate weekly  2.Erectile dysfunction - ongeneric sildenafil prn  3.BPH with lower urinary tract symptoms -Tamsulosin 0.4 mg daily   HPI: 64 y.o. male presents for annual follow-up.   Remains on testosterone with good symptom relief  Stable lower urinary tract symptoms on tamsulosin  IPSS today 14/35 with most bothersome symptom weak stream  Denies dysuria, gross hematuria  Denies flank, abdominal or pelvic pain  Good efficacy with sildenafil as needed  Labs 12/01/2019:T level 859; HCT 41; PSA stable at 1.6     PMH: Past Medical History:  Diagnosis Date  . ADD (attention deficit disorder)   . Arthritis   . BPH (benign prostatic hyperplasia)   . Depression   . History of kidney stones   . Hypertension   . Kidney stone   . LVH (left ventricular hypertrophy) 02/12/2014   due to hypertensive disease, without heart failure  . Nephrolithiasis   . Sleep apnea    CPAP  . Sleep related gastroesophageal reflux disease   . Testosterone deficiency   . Venous insufficiency of both lower extremities 09/25/2016    Surgical History: Past Surgical History:  Procedure Laterality Date  . APPENDECTOMY     2009  . athroplasty on left hand Left 3 years ago  . COLONOSCOPY WITH PROPOFOL N/A 10/14/2017   Procedure: COLONOSCOPY WITH PROPOFOL;  Surgeon: Manya Silvas, MD;  Location: [facility] ENDOSCOPY;  Service: Endoscopy;  Laterality: N/A;  . HERNIA REPAIR  2009   Laparascopic incisional/umbilical/ventral hernia repair  . JOINT REPLACEMENT Left 05/2013  . LITHOTRIPSY  2003    Home Medications:  Allergies as of 12/10/2019   No Known Allergies     Medication List        Accurate as of December 10, 2019 11:28 AM. If you have any questions, ask your nurse or doctor.        STOP taking these medications   ranitidine 300 MG tablet Commonly known as: ZANTAC Stopped by: Abbie Sons, MD   traZODone 50 MG tablet Commonly known as: DESYREL Stopped by: Abbie Sons, MD     TAKE these medications   ALPRAZolam 0.25 MG tablet Commonly known as: XANAX Take 0.25 mg by mouth as needed for anxiety.   amLODipine 10 MG tablet Commonly known as: NORVASC Take 10 mg by mouth daily. What changed: Another medication with the same name was removed. Continue taking this medication, and follow the directions you see here. Changed by: Abbie Sons, MD   amphetamine-dextroamphetamine 30 MG 24 hr capsule Commonly known as: ADDERALL XR Take 30 mg by mouth daily.   amphetamine-dextroamphetamine 10 MG tablet Commonly known as: ADDERALL Take 10 mg by mouth daily.   ARIPiprazole 2 MG tablet Commonly known as: ABILIFY Take 2 mg by mouth daily. What changed: Another medication with the same name was removed. Continue taking this medication, and follow the directions you see here. Changed by: Abbie Sons, MD   aspirin EC 81 MG tablet Take 81 mg by mouth daily.   famotidine 40 MG tablet Commonly known as: PEPCID Take 40 mg by mouth daily.   losartan 100 MG tablet Commonly known as: COZAAR Take 100 mg by mouth  daily.   multivitamin tablet Take 1 tablet by mouth daily.   omeprazole 40 MG capsule Commonly known as: PRILOSEC omeprazole 40 mg capsule,delayed release   sertraline 50 MG tablet Commonly known as: ZOLOFT Take 1 tablet (50 mg total) by mouth daily.   sildenafil 20 MG tablet Commonly known as: REVATIO Take 2-5 tablets by mouth continuously as needed   silodosin 8 MG Caps capsule Commonly known as: RAPAFLO Take 1 capsule (8 mg total) by mouth daily with breakfast.   tamsulosin 0.4 MG Caps capsule Commonly known as:  Flomax Take 2 capsules (0.8 mg total) by mouth daily. Take once daily 30 minutes after the same meal each day   testosterone cypionate 200 MG/ML injection Commonly known as: DEPOTESTOSTERONE CYPIONATE INJECT  0.5 ML (CC) INTRAMUSCULARLY ONCE A WEEK   traMADol 50 MG tablet Commonly known as: ULTRAM Take 50 mg by mouth every 6 (six) hours as needed.   valsartan 160 MG tablet Commonly known as: DIOVAN Take 160 mg by mouth 2 (two) times daily.   zolpidem 10 MG tablet Commonly known as: AMBIEN       Allergies: No Known Allergies  Family History: Family History  Problem Relation Age of Onset  . Cancer Mother   . Cancer Father   . Heart disease Father     Social History:  reports that he has never smoked. He has never used smokeless tobacco. He reports that he does not drink alcohol and does not use drugs.   Physical Exam: BP 139/71 (BP Location: Left Arm, Patient Position: Sitting, Cuff Size: Normal)   Pulse 66   Ht 5\' 9"  (1.753 m)   Wt 190 lb 4.8 oz (86.3 kg)   BMI 28.10 kg/m   Constitutional:  Alert and oriented, No acute distress. HEENT: [place] AT, moist mucus membranes.  Trachea midline, no masses. Cardiovascular: No clubbing, cyanosis, or edema. Respiratory: Normal respiratory effort, no increased work of breathing. Neurologic: Grossly intact, no focal deficits, moving all 4 extremities. Psychiatric: Normal mood and affect.   Assessment & Plan:    1. Hypogonadism in male  Stable on TRT  Testosterone refilled  Lab visit 6 months for testosterone/hematocrit  Office visit 1 year DRE, testosterone, PSA, hematocrit  2. Benign prostatic hyperplasia with lower urinary tract symptoms   Moderate LUTS on tamsulosin  We discussed UroLift last year and he had additional questions, he may be interested in the future  Tamsulosin refilled   3. Erectile dysfunction due to arterial insufficiency  Stable  Good efficacy with sildenafil which was refilled   Abbie Sons, MD  [facility] [address], [place], [place] [address] [phone]

## 2020-06-07 ENCOUNTER — Other Ambulatory Visit: Payer: Self-pay | Admitting: Family Medicine

## 2020-06-07 DIAGNOSIS — E291 Testicular hypofunction: Secondary | ICD-10-CM

## 2020-06-08 ENCOUNTER — Other Ambulatory Visit: Payer: Self-pay

## 2020-06-08 ENCOUNTER — Other Ambulatory Visit: Payer: BC Managed Care – PPO

## 2020-06-08 DIAGNOSIS — E291 Testicular hypofunction: Secondary | ICD-10-CM

## 2020-06-09 ENCOUNTER — Telehealth: Payer: Self-pay | Admitting: *Deleted

## 2020-06-09 LAB — HEMATOCRIT: Hematocrit: 40.9 % (ref 37.5–51.0)

## 2020-06-09 LAB — TESTOSTERONE: Testosterone: 396 ng/dL (ref 264–916)

## 2020-06-09 NOTE — Telephone Encounter (Signed)
-----   Message from Abbie Sons, MD sent at 06/09/2020  8:32 AM EST ----- Testosterone level looks good at 396.  Hematocrit normal at 40.9

## 2020-06-09 NOTE — Telephone Encounter (Signed)
Notified patient as instructed, patient pleased. Discussed follow-up appointments, patient agrees  

## 2020-08-12 ENCOUNTER — Other Ambulatory Visit: Payer: Self-pay | Admitting: Urology

## 2020-09-28 ENCOUNTER — Other Ambulatory Visit: Payer: Self-pay | Admitting: Urology

## 2020-10-18 ENCOUNTER — Other Ambulatory Visit: Payer: Self-pay | Admitting: *Deleted

## 2020-10-18 DIAGNOSIS — N401 Enlarged prostate with lower urinary tract symptoms: Secondary | ICD-10-CM

## 2020-10-18 DIAGNOSIS — R3912 Poor urinary stream: Secondary | ICD-10-CM

## 2020-10-27 ENCOUNTER — Other Ambulatory Visit: Payer: Self-pay | Admitting: Urology

## 2020-12-09 ENCOUNTER — Other Ambulatory Visit: Payer: BC Managed Care – PPO

## 2020-12-09 ENCOUNTER — Other Ambulatory Visit: Payer: Self-pay

## 2020-12-09 DIAGNOSIS — N401 Enlarged prostate with lower urinary tract symptoms: Secondary | ICD-10-CM

## 2020-12-10 LAB — PSA: Prostate Specific Ag, Serum: 1.1 ng/mL (ref 0.0–4.0)

## 2020-12-10 LAB — TESTOSTERONE: Testosterone: 648 ng/dL (ref 264–916)

## 2020-12-10 LAB — HEMATOCRIT: Hematocrit: 43.2 % (ref 37.5–51.0)

## 2020-12-12 ENCOUNTER — Ambulatory Visit: Payer: Self-pay | Admitting: Urology

## 2020-12-13 NOTE — Progress Notes (Signed)
12/14/2020 9:06 AM   Brett Vance. 1955/04/13 [id number]  Referring provider: Idelle Crouch, MD [facility] [place],  [place] [address]  Chief Complaint  Patient presents with   Hypogonadism    Urologic history: 1.  Hypogonadism - on TRT 100 mg testosterone cypionate weekly   2.  Erectile dysfunction - on generic sildenafil prn   3.  BPH with lower urinary tract symptoms -Tamsulosin 0.4 mg daily  HPI: 65 y.o. male presents for annual follow-up.  Doing well since last visit No bothersome LUTS Denies dysuria, gross hematuria Remains on tamsulosin Remains on TRT with good energy level/libido 56-monthinterim labs 05/2020 with testosterone level 396 and hct 40.9 Labs 12/09/2020: Testosterone 648; HCT 43.2; PSA 1.1 Denies flank, abdominal or pelvic pain    PMH: Past Medical History:  Diagnosis Date   ADD (attention deficit disorder)    Arthritis    BPH (benign prostatic hyperplasia)    Depression    History of kidney stones    Hypertension    Kidney stone    LVH (left ventricular hypertrophy) 02/12/2014   due to hypertensive disease, without heart failure   Nephrolithiasis    Sleep apnea    CPAP   Sleep related gastroesophageal reflux disease    Testosterone deficiency    Venous insufficiency of both lower extremities 09/25/2016    Surgical History: Past Surgical History:  Procedure Laterality Date   APPENDECTOMY     2009   athroplasty on left hand Left 3 years ago   COLONOSCOPY WITH PROPOFOL N/A 10/14/2017   Procedure: COLONOSCOPY WITH PROPOFOL;  Surgeon: EManya Silvas MD;  [facility];  Service: Endoscopy;  Laterality: N/A;   HERNIA REPAIR  2009   Laparascopic incisional/umbilical/ventral hernia repair   JOINT REPLACEMENT Left 05/2013   LITHOTRIPSY  2003    Home Medications:  Allergies as of 12/14/2020   No Known Allergies      Medication List        Accurate as of December 14, 2020   9:06 AM. If you have any questions, ask your nurse or doctor.          ALPRAZolam 0.25 MG tablet Commonly known as: XANAX Take 0.25 mg by mouth as needed for anxiety.   amLODipine 10 MG tablet Commonly known as: NORVASC Take 10 mg by mouth daily.   amphetamine-dextroamphetamine 30 MG 24 hr capsule Commonly known as: ADDERALL XR Take 30 mg by mouth daily.   amphetamine-dextroamphetamine 10 MG tablet Commonly known as: ADDERALL Take 10 mg by mouth daily.   ARIPiprazole 2 MG tablet Commonly known as: ABILIFY Take 2 mg by mouth daily.   aspirin EC 81 MG tablet Take 81 mg by mouth daily.   famotidine 40 MG tablet Commonly known as: PEPCID Take 40 mg by mouth daily.   losartan 100 MG tablet Commonly known as: COZAAR Take 100 mg by mouth daily.   multivitamin tablet Take 1 tablet by mouth daily.   omeprazole 40 MG capsule Commonly known as: PRILOSEC omeprazole 40 mg capsule,delayed release   sertraline 50 MG tablet Commonly known as: ZOLOFT Take 1 tablet (50 mg total) by mouth daily.   sildenafil 20 MG tablet Commonly known as: REVATIO Take 2-5 tablets by mouth continuously as needed   tamsulosin 0.4 MG Caps capsule Commonly known as: Flomax Take 2 capsules (0.8 mg total) by mouth daily. Take once daily 30 minutes after the same meal each day  testosterone cypionate 200 MG/ML injection Commonly known as: DEPOTESTOSTERONE CYPIONATE INJECT 0.5ML INTRAMUSCULARLY ONCE A WEEK   traMADol 50 MG tablet Commonly known as: ULTRAM Take 50 mg by mouth every 6 (six) hours as needed.   valsartan 160 MG tablet Commonly known as: DIOVAN Take 160 mg by mouth 2 (two) times daily.   zolpidem 10 MG tablet Commonly known as: AMBIEN        Allergies: No Known Allergies  Family History: Family History  Problem Relation Age of Onset   Cancer Mother    Cancer Father    Heart disease Father     Social History:  reports that he has never smoked. He has never used  smokeless tobacco. He reports that he does not drink alcohol and does not use drugs.   Physical Exam: BP 130/73   Pulse 72   Ht '5\' 10"'$  (1.778 m)   Wt 185 lb (83.9 kg)   BMI 26.54 kg/m   Constitutional:  Alert and oriented, No acute distress. HEENT: [place] AT, moist mucus membranes.  Trachea midline, no masses. Cardiovascular: No clubbing, cyanosis, or edema. Respiratory: Normal respiratory effort, no increased work of breathing. GI: Abdomen is soft, nontender, nondistended, no abdominal masses GU: Prostate 40 g, smooth without nodules   Assessment & Plan:    1.  Hypogonadism Stable on TRT Testosterone refilled Lab visit 6 months testosterone/hematocrit Annual follow-up with testosterone/hematocrit/PSA/DRE  2.  BPH with LUTS Moderate LUTS stable on tamsulosin  3.  Erectile dysfunction Stable on sildenafil    Abbie Sons, MD  [facility] [address] [place], [place] [address] [phone]

## 2020-12-14 ENCOUNTER — Ambulatory Visit (INDEPENDENT_AMBULATORY_CARE_PROVIDER_SITE_OTHER): Payer: BC Managed Care – PPO | Admitting: Urology

## 2020-12-14 ENCOUNTER — Other Ambulatory Visit: Payer: Self-pay

## 2020-12-14 ENCOUNTER — Encounter: Payer: Self-pay | Admitting: Urology

## 2020-12-14 VITALS — BP 130/73 | HR 72 | Ht 70.0 in | Wt 185.0 lb

## 2020-12-14 DIAGNOSIS — N401 Enlarged prostate with lower urinary tract symptoms: Secondary | ICD-10-CM | POA: Diagnosis not present

## 2020-12-14 DIAGNOSIS — E291 Testicular hypofunction: Secondary | ICD-10-CM

## 2020-12-14 DIAGNOSIS — R3912 Poor urinary stream: Secondary | ICD-10-CM

## 2020-12-14 DIAGNOSIS — N5201 Erectile dysfunction due to arterial insufficiency: Secondary | ICD-10-CM

## 2020-12-14 MED ORDER — TESTOSTERONE CYPIONATE 200 MG/ML IM SOLN: INTRAMUSCULAR | 0 refills | Status: DC

## 2020-12-15 ENCOUNTER — Other Ambulatory Visit: Payer: Self-pay | Admitting: Internal Medicine

## 2020-12-15 DIAGNOSIS — E782 Mixed hyperlipidemia: Secondary | ICD-10-CM

## 2020-12-21 ENCOUNTER — Ambulatory Visit
Admission: RE | Admit: 2020-12-21 | Discharge: 2020-12-21 | Disposition: A | Discharge status: Home / Self Care | Payer: Self-pay | Source: Ambulatory Visit | Attending: Internal Medicine | Admitting: Internal Medicine

## 2020-12-21 ENCOUNTER — Other Ambulatory Visit: Payer: Self-pay

## 2020-12-21 DIAGNOSIS — E782 Mixed hyperlipidemia: Secondary | ICD-10-CM | POA: Insufficient documentation

## 2021-01-31 ENCOUNTER — Other Ambulatory Visit: Payer: Self-pay | Admitting: Urology

## 2021-01-31 DIAGNOSIS — N5201 Erectile dysfunction due to arterial insufficiency: Secondary | ICD-10-CM

## 2021-02-19 ENCOUNTER — Encounter: Payer: Self-pay | Admitting: Emergency Medicine

## 2021-02-19 ENCOUNTER — Ambulatory Visit
Admission: EM | Admit: 2021-02-19 | Discharge: 2021-02-19 | Disposition: A | Discharge status: Home / Self Care | Payer: BC Managed Care – PPO | Attending: Emergency Medicine | Admitting: Emergency Medicine

## 2021-02-19 ENCOUNTER — Other Ambulatory Visit: Payer: Self-pay

## 2021-02-19 DIAGNOSIS — B349 Viral infection, unspecified: Secondary | ICD-10-CM

## 2021-02-19 DIAGNOSIS — Z20822 Contact with and (suspected) exposure to covid-19: Secondary | ICD-10-CM | POA: Diagnosis not present

## 2021-02-19 MED ORDER — MOLNUPIRAVIR EUA 200MG CAPSULE: 4.0000 | ORAL_CAPSULE | Freq: Two times a day (BID) | ORAL | 0 refills | Status: AC

## 2021-02-19 NOTE — ED Triage Notes (Signed)
Wife is positive for COVID, and pt presents with body aches, sore throat and congestion since yesterday.

## 2021-02-19 NOTE — ED Provider Notes (Signed)
Brett Vance    CSN: [id number] Arrival date & time: 02/19/21  0846      History   Chief Complaint Chief Complaint  Patient presents with   Nasal Congestion   Sore Throat   Generalized Body Aches    HPI Brett Vance. is a 65 y.o. male.  Patient presents with body aches, congestion, sore throat since yesterday.  His wife tested positive for COVID at home this morning.  He has been taking Tylenol for his symptoms; last dose 8 hours PTA.  He denies fever, rash, cough, shortness of breath, vomiting, diarrhea, or other symptoms.  His medical history includes hypertension, left ventricular hypertrophy, sleep apnea.  The history is provided by the patient and medical records.   Past Medical History:  Diagnosis Date   ADD (attention deficit disorder)    Arthritis    BPH (benign prostatic hyperplasia)    Depression    History of kidney stones    Hypertension    Kidney stone    LVH (left ventricular hypertrophy) 02/12/2014   due to hypertensive disease, without heart failure   Nephrolithiasis    Sleep apnea    CPAP   Sleep related gastroesophageal reflux disease    Testosterone deficiency    Venous insufficiency of both lower extremities 09/25/2016    Patient Active Problem List   Diagnosis Date Noted   Disorder of bursae of shoulder region 12/10/2019   Localized, primary osteoarthritis of hand 12/10/2019   Sprain of shoulder and upper arm 12/10/2019   Shoulder joint pain 12/10/2019   Hypogonadism in male 11/21/2017   Benign prostatic hyperplasia with weak urinary stream 11/21/2017   Erectile dysfunction due to arterial insufficiency 11/21/2017   Venous insufficiency of both lower extremities 09/25/2016   LVH (left ventricular hypertrophy) due to hypertensive disease, without heart failure 09/25/2016   MDD (major depressive disorder), recurrent episode, severe ([place]) 06/26/2016   Gastroesophageal reflux disease without esophagitis 02/22/2016   Rotator cuff  tendinitis, left 02/04/2015   Tear of left rotator cuff 05/03/2014   Benign essential HTN 02/12/2014   Mixed hyperlipidemia 02/12/2014   ADD (attention deficit disorder) 08/06/2013   Depression 08/06/2013   Dysphagia 08/06/2013   Sleep apnea 08/06/2013   Nephrolithiasis 08/06/2013   Encounter for long-term (current) use of medications 04/17/2013   Enlarged prostate with lower urinary tract symptoms (LUTS) 02/07/2012   Chronic prostatitis 02/07/2012   Erectile dysfunction 02/07/2012   Inguinal hernia 02/07/2012   Urinary hesitancy 02/07/2012    Past Surgical History:  Procedure Laterality Date   APPENDECTOMY     2009   athroplasty on left hand Left 3 years ago   COLONOSCOPY WITH PROPOFOL N/A 10/14/2017   Procedure: COLONOSCOPY WITH PROPOFOL;  Surgeon: Manya Silvas, MD;  Location: [facility] ENDOSCOPY;  Service: Endoscopy;  Laterality: N/A;   HERNIA REPAIR  2009   Laparascopic incisional/umbilical/ventral hernia repair   JOINT REPLACEMENT Left 05/2013   LITHOTRIPSY  2003       Home Medications    Prior to Admission medications   Medication Sig Start Date End Date Taking? Authorizing Provider  molnupiravir EUA (LAGEVRIO) 200 mg CAPS capsule Take 4 capsules (800 mg total) by mouth 2 (two) times daily for 5 days. 02/19/21 02/24/21 Yes Sharion Balloon, NP  ALPRAZolam Duanne Moron) 0.25 MG tablet Take 0.25 mg by mouth as needed for anxiety.    [provider]  amLODipine (NORVASC) 10 MG tablet Take 10 mg by mouth daily. 12/02/19  [provider]  amphetamine-dextroamphetamine (ADDERALL XR) 30 MG 24 hr capsule Take 30 mg by mouth daily.    [provider]  amphetamine-dextroamphetamine (ADDERALL) 10 MG tablet Take 10 mg by mouth daily. 11/17/19   [provider]  ARIPiprazole (ABILIFY) 2 MG tablet Take 2 mg by mouth daily. 08/01/19   [provider]  aspirin EC 81 MG tablet Take 81 mg by mouth daily.    [provider]  famotidine (PEPCID)  40 MG tablet Take 40 mg by mouth daily. 10/11/19   [provider]  losartan (COZAAR) 100 MG tablet Take 100 mg by mouth daily.    [provider]  Multiple Vitamin (MULTIVITAMIN) tablet Take 1 tablet by mouth daily.    [provider]  omeprazole (PRILOSEC) 40 MG capsule omeprazole 40 mg capsule,delayed release 09/08/13   [provider]  sertraline (ZOLOFT) 50 MG tablet Take 1 tablet (50 mg total) by mouth daily. 06/29/16   Elvin So, MD  sildenafil (REVATIO) 20 MG tablet Take 2-5 tablets by mouth continuously as needed 12/10/19   Stoioff, Ronda Fairly, MD  tamsulosin (FLOMAX) 0.4 MG CAPS capsule Take 2 capsules (0.8 mg total) by mouth daily. Take once daily 30 minutes after the same meal each day 12/10/19   Stoioff, Ronda Fairly, MD  testosterone cypionate (DEPOTESTOSTERONE CYPIONATE) 200 MG/ML injection INJECT 0.5ML INTRAMUSCULARLY ONCE A WEEK 12/14/20   Stoioff, Ronda Fairly, MD  traMADol (ULTRAM) 50 MG tablet Take 50 mg by mouth every 6 (six) hours as needed.    [provider]  valsartan (DIOVAN) 160 MG tablet Take 160 mg by mouth 2 (two) times daily.    [provider]  zolpidem (AMBIEN) 10 MG tablet  10/14/17   [provider]    Family History Family History  Problem Relation Age of Onset   Cancer Mother    Cancer Father    Heart disease Father     Social History Social History   Tobacco Use   Smoking status: Never   Smokeless tobacco: Never  Substance Use Topics   Alcohol use: No   Drug use: No     Allergies   Patient has no known allergies.   Review of Systems Review of Systems  Constitutional:  Negative for chills and fever.  HENT:  Positive for congestion and sore throat. Negative for ear pain.   Respiratory:  Negative for cough and shortness of breath.   Cardiovascular:  Negative for chest pain and palpitations.  Gastrointestinal:  Negative for diarrhea and vomiting.  Skin:  Negative for color change and rash.   All other systems reviewed and are negative.   Physical Exam Triage Vital Signs ED Triage Vitals  Enc Vitals Group     BP 02/19/21 0909 (!) 147/78     Pulse Rate 02/19/21 0909 72     Resp 02/19/21 0909 18     Temp 02/19/21 0909 98.3 F (36.8 C)     Temp Source 02/19/21 0909 Oral     SpO2 02/19/21 0909 98 %     Weight --      Height --      Head Circumference --      Peak Flow --      Pain Score 02/19/21 0911 4     Pain Loc --      Pain Edu? --      Excl. in [place]? --    No data found.  Updated Vital Signs BP (!) 147/78  Pulse 72   Temp 98.3 F (36.8 C) (Oral)   Resp 18   SpO2 98%   Visual Acuity Right Eye Distance:   Left Eye Distance:   Bilateral Distance:    Right Eye Near:   Left Eye Near:    Bilateral Near:     Physical Exam Vitals and nursing note reviewed.  Constitutional:      General: He is not in acute distress.    Appearance: He is well-developed.  HENT:     Head: Normocephalic and atraumatic.     Right Ear: Tympanic membrane normal.     Left Ear: Tympanic membrane normal.     Nose: Nose normal.     Mouth/Throat:     Mouth: Mucous membranes are moist.     Pharynx: Oropharynx is clear.  Cardiovascular:     Rate and Rhythm: Normal rate and regular rhythm.     Heart sounds: Normal heart sounds.  Pulmonary:     Effort: Pulmonary effort is normal. No respiratory distress.     Breath sounds: Normal breath sounds.  Abdominal:     Palpations: Abdomen is soft.     Tenderness: There is no abdominal tenderness.  Musculoskeletal:     Cervical back: Neck supple.  Skin:    General: Skin is warm and dry.  Neurological:     Mental Status: He is alert.  Psychiatric:        Mood and Affect: Mood normal.        Behavior: Behavior normal.     UC Treatments / Results  Labs (all labs ordered are listed, but only abnormal results are displayed) Labs Reviewed  NOVEL CORONAVIRUS, NAA    EKG   Radiology No results found.  Procedures Procedures  (including critical care time)  Medications Ordered in UC Medications - No data to display  Initial Impression / Assessment and Plan / UC Course  I have reviewed the triage vital signs and the nursing notes.  Pertinent labs & imaging results that were available during my care of the patient were reviewed by me and considered in my medical decision making (see chart for details).  Exposure to COVID, viral illness.  Patient's symptoms started yesterday.  His wife tested COVID positive this morning at home.  Discussed treatment options.  Based on his exposure, symptoms, medical history, starting on molnupiravir.  Discussed side effects of this medicine.  PCR COVID pending.  Discussed with patient that he should stop the molnupiravir if the COVID test is negative.  Discussed continuing symptomatic treatment.  Instructed patient to follow-up with his PCP tomorrow via telephone.  ED precautions discussed.  He agrees to plan of care.   Final Clinical Impressions(s) / UC Diagnoses   Final diagnoses:  Exposure to COVID-19 virus  Viral illness     Discharge Instructions      You should self-quarantine according to the CDC guidelines.  See attached.    Take the molnupiravir as directed.    Go to the emergency department if you have high fever, shortness of breath, severe diarrhea, or other concerning symptoms.         ED Prescriptions     Medication Sig Dispense Auth. Provider   molnupiravir EUA (LAGEVRIO) 200 mg CAPS capsule Take 4 capsules (800 mg total) by mouth 2 (two) times daily for 5 days. 40 capsule Sharion Balloon, NP      PDMP not reviewed this encounter.   Sharion Balloon, NP 02/19/21 [phone]

## 2021-02-19 NOTE — Discharge Instructions (Addendum)
You should self-quarantine according to the CDC guidelines.  See attached.    Take the molnupiravir as directed.    Go to the emergency department if you have high fever, shortness of breath, severe diarrhea, or other concerning symptoms.

## 2021-02-20 LAB — SARS-COV-2, NAA 2 DAY TAT

## 2021-02-20 LAB — NOVEL CORONAVIRUS, NAA: SARS-CoV-2, NAA: DETECTED — AB

## 2021-03-02 ENCOUNTER — Other Ambulatory Visit: Payer: Self-pay | Admitting: Urology

## 2021-03-02 DIAGNOSIS — N5201 Erectile dysfunction due to arterial insufficiency: Secondary | ICD-10-CM

## 2021-06-13 ENCOUNTER — Other Ambulatory Visit: Payer: Self-pay

## 2021-06-13 ENCOUNTER — Other Ambulatory Visit: Payer: BC Managed Care – PPO

## 2021-06-13 DIAGNOSIS — E291 Testicular hypofunction: Secondary | ICD-10-CM

## 2021-06-13 DIAGNOSIS — N401 Enlarged prostate with lower urinary tract symptoms: Secondary | ICD-10-CM

## 2021-06-14 LAB — TESTOSTERONE: Testosterone: 927 ng/dL — ABNORMAL HIGH (ref 264–916)

## 2021-06-14 LAB — HEMATOCRIT: Hematocrit: 44.1 % (ref 37.5–51.0)

## 2021-06-14 LAB — PSA: Prostate Specific Ag, Serum: 1 ng/mL (ref 0.0–4.0)

## 2021-06-15 ENCOUNTER — Encounter: Payer: Self-pay | Admitting: Urology

## 2021-07-17 ENCOUNTER — Telehealth: Payer: Self-pay | Admitting: Urology

## 2021-07-17 DIAGNOSIS — N401 Enlarged prostate with lower urinary tract symptoms: Secondary | ICD-10-CM

## 2021-07-17 MED ORDER — TAMSULOSIN HCL 0.4 MG PO CAPS: 0.8000 mg | ORAL_CAPSULE | Freq: Every day | ORAL | 3 refills | Status: DC

## 2021-07-17 NOTE — Telephone Encounter (Signed)
Patient called asking for a new rx to be sent to his new mail order pharmacy. ?I have updated it in Epic to [facility] ?He needs his Flomax sent there. ? ? ?Sharyn Lull ?

## 2021-07-17 NOTE — Telephone Encounter (Signed)
RX has been sent to [facility].  ?

## 2021-08-15 ENCOUNTER — Other Ambulatory Visit: Payer: Self-pay | Admitting: Urology

## 2021-10-06 ENCOUNTER — Other Ambulatory Visit: Payer: Self-pay | Admitting: Urology

## 2021-10-06 DIAGNOSIS — N5201 Erectile dysfunction due to arterial insufficiency: Secondary | ICD-10-CM

## 2021-12-14 ENCOUNTER — Other Ambulatory Visit: Payer: BC Managed Care – PPO

## 2021-12-14 DIAGNOSIS — E291 Testicular hypofunction: Secondary | ICD-10-CM

## 2021-12-14 DIAGNOSIS — N401 Enlarged prostate with lower urinary tract symptoms: Secondary | ICD-10-CM

## 2021-12-15 ENCOUNTER — Ambulatory Visit (INDEPENDENT_AMBULATORY_CARE_PROVIDER_SITE_OTHER): Payer: BC Managed Care – PPO | Admitting: Urology

## 2021-12-15 ENCOUNTER — Encounter: Payer: Self-pay | Admitting: Urology

## 2021-12-15 VITALS — BP 160/68 | HR 85 | Ht 68.0 in | Wt 190.0 lb

## 2021-12-15 DIAGNOSIS — R3912 Poor urinary stream: Secondary | ICD-10-CM

## 2021-12-15 DIAGNOSIS — E291 Testicular hypofunction: Secondary | ICD-10-CM

## 2021-12-15 DIAGNOSIS — N401 Enlarged prostate with lower urinary tract symptoms: Secondary | ICD-10-CM

## 2021-12-15 DIAGNOSIS — N5201 Erectile dysfunction due to arterial insufficiency: Secondary | ICD-10-CM

## 2021-12-15 LAB — TESTOSTERONE: Testosterone: 784 ng/dL (ref 264–916)

## 2021-12-15 LAB — HEMATOCRIT: Hematocrit: 39.9 % (ref 37.5–51.0)

## 2021-12-15 LAB — PSA: Prostate Specific Ag, Serum: 1.2 ng/mL (ref 0.0–4.0)

## 2021-12-15 MED ORDER — SILDENAFIL CITRATE 20 MG PO TABS: ORAL_TABLET | ORAL | 0 refills | Status: DC

## 2021-12-15 NOTE — Progress Notes (Signed)
12/15/2021 10:17 AM   Brett Vance. 1955/04/18 [id number]  Referring provider: Idelle Crouch, MD [facility] [place],  [place] [address]  Chief Complaint  Patient presents with   Hypogonadism    Urologic history: 1.  Hypogonadism - on TRT 100 mg testosterone cypionate weekly   2.  Erectile dysfunction - on generic sildenafil prn   3.  BPH with lower urinary tract symptoms -Tamsulosin 0.4 mg daily  HPI: 66 y.o. male presents for annual follow-up.  Doing well since last visit No bothersome LUTS Denies dysuria, gross hematuria Remains on tamsulosin Remains on TRT with good energy level/libido Labs 12/14/2021: Testosterone 784; HCT 39.9; PSA 1.2 Denies flank, abdominal or pelvic pain    PMH: Past Medical History:  Diagnosis Date   ADD (attention deficit disorder)    Arthritis    BPH (benign prostatic hyperplasia)    Depression    History of kidney stones    Hypertension    Kidney stone    LVH (left ventricular hypertrophy) 02/12/2014   due to hypertensive disease, without heart failure   Nephrolithiasis    Sleep apnea    CPAP   Sleep related gastroesophageal reflux disease    Testosterone deficiency    Venous insufficiency of both lower extremities 09/25/2016    Surgical History: Past Surgical History:  Procedure Laterality Date   APPENDECTOMY     2009   athroplasty on left hand Left 3 years ago   COLONOSCOPY WITH PROPOFOL N/A 10/14/2017   Procedure: COLONOSCOPY WITH PROPOFOL;  Surgeon: Manya Silvas, MD;  Location: [facility] ENDOSCOPY;  Service: Endoscopy;  Laterality: N/A;   HERNIA REPAIR  2009   Laparascopic incisional/umbilical/ventral hernia repair   JOINT REPLACEMENT Left 05/2013   LITHOTRIPSY  2003    Home Medications:  Allergies as of 12/15/2021   No Known Allergies      Medication List        Accurate as of December 15, 2021 10:17 AM. If you have any questions, ask your nurse or doctor.           ALPRAZolam 0.25 MG tablet Commonly known as: XANAX Take 0.25 mg by mouth as needed for anxiety.   amLODipine 10 MG tablet Commonly known as: NORVASC Take 10 mg by mouth daily.   amphetamine-dextroamphetamine 30 MG 24 hr capsule Commonly known as: ADDERALL XR Take 30 mg by mouth daily.   amphetamine-dextroamphetamine 10 MG tablet Commonly known as: ADDERALL Take 10 mg by mouth daily.   ARIPiprazole 2 MG tablet Commonly known as: ABILIFY Take 2 mg by mouth daily.   aspirin EC 81 MG tablet Take 81 mg by mouth daily.   famotidine 40 MG tablet Commonly known as: PEPCID Take 40 mg by mouth daily.   losartan 100 MG tablet Commonly known as: COZAAR Take 100 mg by mouth daily.   multivitamin tablet Take 1 tablet by mouth daily.   omeprazole 40 MG capsule Commonly known as: PRILOSEC omeprazole 40 mg capsule,delayed release   sertraline 50 MG tablet Commonly known as: ZOLOFT Take 1 tablet (50 mg total) by mouth daily.   sildenafil 20 MG tablet Commonly known as: REVATIO TAKE 2 TO 5 TABLETS BY MOUTH CONTINUOUSLY AS NEEDED   tamsulosin 0.4 MG Caps capsule Commonly known as: Flomax Take 2 capsules (0.8 mg total) by mouth daily. Take once daily 30 minutes after the same meal each day   testosterone cypionate 200 MG/ML injection Commonly known as: DEPOTESTOSTERONE CYPIONATE  INJECT 0.5MLS INTRAMUSCULARLY ONCE WEEKLY   traMADol 50 MG tablet Commonly known as: ULTRAM Take 50 mg by mouth every 6 (six) hours as needed.   valsartan 160 MG tablet Commonly known as: DIOVAN Take 160 mg by mouth 2 (two) times daily.   zolpidem 10 MG tablet Commonly known as: AMBIEN        Allergies: No Known Allergies  Family History: Family History  Problem Relation Age of Onset   Cancer Mother    Cancer Father    Heart disease Father     Social History:  reports that he has never smoked. He has never used smokeless tobacco. He reports that he does not drink alcohol and  does not use drugs.   Physical Exam: BP (!) 160/68   Pulse 85   Ht '5\' 8"'$  (1.727 m)   Wt 190 lb (86.2 kg)   BMI 28.89 kg/m   Constitutional:  Alert and oriented, No acute distress. HEENT: [place] AT, moist mucus membranes.  Trachea midline, no masses. Cardiovascular: No clubbing, cyanosis, or edema. Respiratory: Normal respiratory effort, no increased work of breathing. GI: Abdomen is soft, nontender, nondistended, no abdominal masses GU: Prostate 40 g, smooth without nodules   Assessment & Plan:    1.  Hypogonadism Stable on TRT Lab visit 6 months testosterone/hematocrit Annual follow-up with testosterone/hematocrit/PSA/DRE  2.  BPH with LUTS Moderate LUTS stable on tamsulosin We discussed other causes of nocturia including sleep apnea.  He has been diagnosed but is not using CPAP Due to his job he also drinks fluids in the summer up until 8 PM which may be a contributing factor  3.  Erectile dysfunction Stable on sildenafil 70 Refill sent pharmacy    Abbie Sons, [place] [address], [employer] [place],  [address] [phone]

## 2022-05-18 ENCOUNTER — Other Ambulatory Visit: Payer: Self-pay | Admitting: Urology

## 2022-05-18 DIAGNOSIS — N5201 Erectile dysfunction due to arterial insufficiency: Secondary | ICD-10-CM

## 2022-05-22 ENCOUNTER — Telehealth: Payer: Self-pay | Admitting: Family Medicine

## 2022-05-22 NOTE — Telephone Encounter (Signed)
Patient notified Testosterone Cypionate has been approved Approval 1219/2024-03/20/2024 Case # [id number]

## 2022-05-30 ENCOUNTER — Other Ambulatory Visit: Payer: Self-pay | Admitting: Urology

## 2022-05-30 DIAGNOSIS — N401 Enlarged prostate with lower urinary tract symptoms: Secondary | ICD-10-CM

## 2022-06-18 ENCOUNTER — Other Ambulatory Visit: Payer: BC Managed Care – PPO

## 2022-06-18 DIAGNOSIS — E291 Testicular hypofunction: Secondary | ICD-10-CM

## 2022-06-19 LAB — HEMATOCRIT: Hematocrit: 40.7 % (ref 37.5–51.0)

## 2022-06-19 LAB — TESTOSTERONE: Testosterone: 508 ng/dL (ref 264–916)

## 2022-06-21 ENCOUNTER — Encounter: Payer: Self-pay | Admitting: Urology

## 2022-09-11 ENCOUNTER — Other Ambulatory Visit: Payer: Self-pay | Admitting: Internal Medicine

## 2022-09-11 DIAGNOSIS — I2510 Atherosclerotic heart disease of native coronary artery without angina pectoris: Secondary | ICD-10-CM

## 2022-09-14 ENCOUNTER — Ambulatory Visit
Admission: RE | Admit: 2022-09-14 | Discharge: 2022-09-14 | Disposition: A | Discharge status: Home / Self Care | Payer: BC Managed Care – PPO | Source: Ambulatory Visit | Attending: Internal Medicine | Admitting: Internal Medicine

## 2022-09-14 DIAGNOSIS — I2510 Atherosclerotic heart disease of native coronary artery without angina pectoris: Secondary | ICD-10-CM

## 2022-11-04 ENCOUNTER — Other Ambulatory Visit: Payer: Self-pay | Admitting: Urology

## 2022-11-04 DIAGNOSIS — N401 Enlarged prostate with lower urinary tract symptoms: Secondary | ICD-10-CM

## 2022-12-17 ENCOUNTER — Other Ambulatory Visit: Payer: Self-pay | Admitting: *Deleted

## 2022-12-17 ENCOUNTER — Other Ambulatory Visit: Payer: BC Managed Care – PPO

## 2022-12-17 DIAGNOSIS — E291 Testicular hypofunction: Secondary | ICD-10-CM

## 2022-12-17 DIAGNOSIS — R972 Elevated prostate specific antigen [PSA]: Secondary | ICD-10-CM

## 2022-12-20 ENCOUNTER — Ambulatory Visit: Payer: BC Managed Care – PPO | Admitting: Urology

## 2022-12-31 ENCOUNTER — Other Ambulatory Visit: Payer: BC Managed Care – PPO

## 2022-12-31 DIAGNOSIS — R972 Elevated prostate specific antigen [PSA]: Secondary | ICD-10-CM

## 2022-12-31 DIAGNOSIS — E291 Testicular hypofunction: Secondary | ICD-10-CM

## 2023-01-01 LAB — TESTOSTERONE: Testosterone: 676 ng/dL (ref 264–916)

## 2023-01-01 LAB — HEMATOCRIT: Hematocrit: 44.6 % (ref 37.5–51.0)

## 2023-01-01 LAB — PSA: Prostate Specific Ag, Serum: 1 ng/mL (ref 0.0–4.0)

## 2023-01-03 ENCOUNTER — Encounter: Payer: Self-pay | Admitting: Urology

## 2023-01-03 ENCOUNTER — Ambulatory Visit: Payer: BC Managed Care – PPO | Admitting: Urology

## 2023-01-03 VITALS — BP 147/76 | HR 64 | Ht 68.0 in | Wt 195.0 lb

## 2023-01-03 DIAGNOSIS — E291 Testicular hypofunction: Secondary | ICD-10-CM | POA: Diagnosis not present

## 2023-01-03 DIAGNOSIS — N401 Enlarged prostate with lower urinary tract symptoms: Secondary | ICD-10-CM

## 2023-01-03 DIAGNOSIS — N5201 Erectile dysfunction due to arterial insufficiency: Secondary | ICD-10-CM | POA: Diagnosis not present

## 2023-01-03 MED ORDER — SILDENAFIL CITRATE 20 MG PO TABS: ORAL_TABLET | ORAL | 0 refills | Status: DC

## 2023-01-03 NOTE — Progress Notes (Signed)
I, Brett Vance, acting as a scribe for Riki Altes, MD., have documented all relevant documentation on the behalf of Riki Altes, MD, as directed by Riki Altes, MD while in the presence of Riki Altes, MD.  01/03/2023 10:09 AM   Brett Vance. 07-12-1955 [id number]  Referring provider: Marguarite Arbour, MD [address] [facility] [place],  [place] [address]  Chief Complaint  Patient presents with   Hypogonadism   Urologic history: 1.  Hypogonadism TRT 100 mg testosterone cypionate weekly   2.  Erectile dysfunction sildenafil prn   3.  BPH with lower urinary tract symptoms Tamsulosin 0.4 mg daily  HPI: Brett Vance. is a 67 y.o. male presents for annual follow-up.  Doing well since last visit No bothersome LUTS Denies dysuria, gross hematuria Remains on tamsulosin Remains on TRT with good energy level/libido Labs 12/31/2022 testosterone 676, HCT 44.6, PSA 1.0. Denies flank, abdominal or pelvic pain.    PSA trend  Prostate Specific Ag, Serum  Latest Ref Rng 0.0 - 4.0 ng/mL  12/02/2018 1.1   06/04/2019 1.6   12/01/2019 1.6   12/09/2020 1.1   06/13/2021 1.0   12/14/2021 1.2   12/31/2022 1.0      PMH: Past Medical History:  Diagnosis Date   ADD (attention deficit disorder)    Arthritis    BPH (benign prostatic hyperplasia)    Depression    History of kidney stones    Hypertension    Kidney stone    LVH (left ventricular hypertrophy) 02/12/2014   due to hypertensive disease, without heart failure   Nephrolithiasis    Sleep apnea    CPAP   Sleep related gastroesophageal reflux disease    Testosterone deficiency    Venous insufficiency of both lower extremities 09/25/2016    Surgical History: Past Surgical History:  Procedure Laterality Date   APPENDECTOMY     2009   athroplasty on left hand Left 3 years ago   COLONOSCOPY WITH PROPOFOL N/A 10/14/2017   Procedure: COLONOSCOPY WITH PROPOFOL;  Surgeon: Scot Jun, MD;  Location: [facility] ENDOSCOPY;  Service: Endoscopy;  Laterality: N/A;   HERNIA REPAIR  2009   Laparascopic incisional/umbilical/ventral hernia repair   JOINT REPLACEMENT Left 05/2013   LITHOTRIPSY  2003    Home Medications:  Allergies as of 01/03/2023   No Known Allergies      Medication List        Accurate as of January 03, 2023 10:09 AM. If you have any questions, ask your nurse or doctor.          STOP taking these medications    amphetamine-dextroamphetamine 10 MG tablet Commonly known as: ADDERALL   amphetamine-dextroamphetamine 30 MG 24 hr capsule Commonly known as: ADDERALL XR       TAKE these medications    ALPRAZolam 0.25 MG tablet Commonly known as: XANAX Take 0.25 mg by mouth as needed for anxiety.   amLODipine 10 MG tablet Commonly known as: NORVASC Take 10 mg by mouth daily.   ARIPiprazole 2 MG tablet Commonly known as: ABILIFY Take 2 mg by mouth daily.   aspirin EC 81 MG tablet Take 81 mg by mouth daily.   famotidine 40 MG tablet Commonly known as: PEPCID Take 40 mg by mouth daily.   losartan 100 MG tablet Commonly known as: COZAAR Take 100 mg by mouth daily.   multivitamin tablet Take 1 tablet by mouth daily.  omeprazole 40 MG capsule Commonly known as: PRILOSEC omeprazole 40 mg capsule,delayed release   sertraline 50 MG tablet Commonly known as: ZOLOFT Take 1 tablet (50 mg total) by mouth daily.   sildenafil 20 MG tablet Commonly known as: REVATIO TAKE 2 TO 5 TABLETS BY MOUTH AS NEEDED FOR ERECTILE DYSFUNCTION   tamsulosin 0.4 MG Caps capsule Commonly known as: FLOMAX TAKE 2 CAPSULES BY MOUTH ONCE  DAILY 1/2 HOUR AFTER THE SAME  MEAL EACH DAY   testosterone cypionate 200 MG/ML injection Commonly known as: DEPOTESTOSTERONE CYPIONATE INJECT 1/2 (ONE-HALF) ML INTRAMUSCULARLY  ONCE A WEEK   traMADol 50 MG tablet Commonly known as: ULTRAM Take 50 mg by mouth every 6 (six) hours as needed.   valsartan 160 MG  tablet Commonly known as: DIOVAN Take 160 mg by mouth 2 (two) times daily.   zolpidem 10 MG tablet Commonly known as: AMBIEN        Allergies: No Known Allergies  Family History: Family History  Problem Relation Age of Onset   Cancer Mother    Cancer Father    Heart disease Father     Social History:  reports that he has never smoked. He has never used smokeless tobacco. He reports that he does not drink alcohol and does not use drugs.   Physical Exam: BP (!) 147/76   Pulse 64   Ht 5\' 8"  (1.727 m)   Wt 195 lb (88.5 kg)   BMI 29.65 kg/m   Constitutional:  Alert and oriented, No acute distress. HEENT: [place] AT Respiratory: Normal respiratory effort, no increased work of breathing. GU: Prostate 40 g, smooth without nodules Psychiatric: Normal mood and affect.   Assessment & Plan:    1.  Hypogonadism Stable on TRT Lab visit 6 months testosterone/hematocrit Annual follow-up with testosterone/hematocrit/PSA/DRE  2. BPH with LUTS Stable on tamsulosin  3. Erectile dysfunction Stable on sildenafil, refills sent to pharmacy.   I have reviewed the above documentation for accuracy and completeness, and I agree with the above.   Riki Altes, MD  [facility] [address], [address] [place], [place] [address] [phone]

## 2023-02-01 ENCOUNTER — Other Ambulatory Visit: Payer: Self-pay | Admitting: Urology

## 2023-06-05 ENCOUNTER — Other Ambulatory Visit: Payer: Self-pay | Admitting: Urology

## 2023-06-05 ENCOUNTER — Other Ambulatory Visit: Payer: Self-pay

## 2023-06-05 MED ORDER — TESTOSTERONE CYPIONATE 200 MG/ML IM SOLN: INTRAMUSCULAR | 0 refills | Status: DC

## 2023-06-07 MED ORDER — TESTOSTERONE CYPIONATE 200 MG/ML IM SOLN: INTRAMUSCULAR | 0 refills | Status: DC

## 2023-07-05 ENCOUNTER — Other Ambulatory Visit: Payer: BC Managed Care – PPO

## 2023-07-05 DIAGNOSIS — E291 Testicular hypofunction: Secondary | ICD-10-CM

## 2023-07-06 LAB — HEMOGLOBIN AND HEMATOCRIT, BLOOD
Hematocrit: 44.5 % (ref 37.5–51.0)
Hemoglobin: 15.1 g/dL (ref 13.0–17.7)

## 2023-07-06 LAB — TESTOSTERONE: Testosterone: 723 ng/dL (ref 264–916)

## 2023-07-07 ENCOUNTER — Encounter: Payer: Self-pay | Admitting: Urology

## 2023-09-06 NOTE — Progress Notes (Signed)
 Established Patient Visit   Chief Complaint: Chief Complaint  Patient presents with  . Hypertension  . Follow-up    1 year   Date of Service: 09/06/2023 Date of Birth: [birth year] PCP: Brett Reyes BIRCH, MD  History of Present Illness: Brett Vance is a 68 y.o.male patient who presents for a 1 year follow up. PMH significant for hypertension, LVH, CAD, hyperlipidemia, OSA, GERD.  Patient states to be doing reasonably well hypertension hyperlipidemia obesity obstructive sleep apnea has no new complaints no recent anginal symptoms here for routine follow-up   Visit Summaries: 09/11/2022 Patient was seen by me for a follow up and presented without any cardiac complaints. No changes were made  Past Medical and Surgical History  Past Medical History Past Medical History:  Diagnosis Date  . ADD (attention deficit disorder)   . Anemia 2015  . Arm pain, right 11/16/2014  . Benign essential HTN 02/12/2014  . BPH (benign prostatic hyperplasia)   . Chest pain   . Complete tear of right rotator cuff 04/29/2014  . Depression   . Dyslipidemia   . Dysphagia 08/06/2013  . Gastroesophageal reflux disease without esophagitis 02/22/2016  . Hyperlipidemia   . Hypertension   . Kidney stone   . Kidney stone   . LVH (left ventricular hypertrophy) due to hypertensive disease, without heart failure 09/25/2016  . Mixed hyperlipidemia 02/12/2014  . Nephrolithiasis   . Osteoarthritis   . Rotator cuff tendinitis, left 02/04/2015  . Sleep apnea    CPAP  . Syncopal episodes   . Tear of left rotator cuff, unspecified tear extent 05/03/2014  . Testosterone  deficiency   . Venous insufficiency of both lower extremities 09/25/2016    Past Surgical History He has a past surgical history that includes Colonoscopy (09/18/2011); Laparoscopic incisional / umbilical / ventral hernia repair (2009); Appendectomy (2009); Lithotripsy (2003); egd (09/08/2013); Appendectomy (2010); Joint replacement (Left, 05/2013);  egd (11/13/2016); and Colonoscopy (10/14/2017).   Medications and Allergies  Current Medications  Current Outpatient Medications  Medication Sig Dispense Refill  . ALPRAZolam (XANAX) 0.25 MG tablet Take 0.25 mg by mouth as needed.      SABRA amLODIPine (NORVASC) 10 MG tablet take 1 tablet by mouth daily 90 tablet 3  . ARIPiprazole (ABILIFY) 2 MG tablet Take 2 mg by mouth once daily    . aspirin 81 MG EC tablet Take 81 mg by mouth once daily.    . famotidine (PEPCID) 40 MG tablet TAKE 1 TABLET NIGHTLY 90 tablet 3  . imipramine (TOFRANIL) 10 MG tablet Take 1 tablet (10 mg total) by mouth at bedtime 30 tablet 11  . losartan (COZAAR) 100 MG tablet take 1 tablet by mouth daily 90 tablet 3  . multivitamin tablet Take 1 tablet by mouth once daily.    . pravastatin (PRAVACHOL) 40 MG tablet Take 1 tablet (40 mg total) by mouth at bedtime 90 tablet 3  . sertraline  (ZOLOFT ) 50 MG tablet Take 50 mg by mouth once daily    . sildenafil  (REVATIO ) 20 mg tablet Take 2-5 tablets by mouth continuously as needed.    . tamsulosin  (FLOMAX ) 0.4 mg capsule Take 0.4 mg by mouth once daily. Take 30 minutes after same meal each day.    . testosterone  cypionate (DEPO-TESTOSTERONE ) 200 mg/mL injection Inject 100 mg into the muscle once a week.    . traMADoL (ULTRAM) 50 mg tablet Take 1 tablet (50 mg total) by mouth every 6 (six) hours as needed for Pain 100 tablet 0  .  venlafaxine (EFFEXOR-XR) 75 MG XR capsule Take 1 capsule by mouth once daily 30 capsule 0   No current facility-administered medications for this visit.    Allergies: Patient has no known allergies.  Social and Family History  Social History  reports that he has never smoked. He has never used smokeless tobacco. He reports that he does not drink alcohol and does not use drugs.  Family History Family History  Problem Relation Name Age of Onset  . Colon cancer Mother Brett Vance   . Stomach cancer Maternal Grandfather    . Heart disease Father     . Lung cancer Father    . Heart disease Paternal Grandmother    . Heart disease Paternal Grandfather    . Inflammatory bowel disease Neg Hx      Review of Systems   Pertinent positives and negatives are mentioned above in HPI and all other systems are negative.  Physical Examination   Vitals:BP (!) 148/64 (BP Location: Left upper arm, Patient Position: Sitting, BP Cuff Size: Adult)   Pulse 70   Resp 14   Ht 172.7 cm (5' 8)   Wt 97.1 kg (214 lb)   SpO2 96%   BMI 32.54 kg/m  Ht:172.7 cm (5' 8) Wt:97.1 kg (214 lb) ADJ:Anib surface area is 2.16 meters squared. Body mass index is 32.54 kg/m.  HEENT: Pupils equally reactive to light and accomodation  Neck: Supple without thyromegaly, carotid pulses 2+ Lungs: clear to auscultation bilaterally; no wheezes, rales, rhonchi Heart: Regular rate and rhythm.  No gallops, murmurs or rub Abdomen: soft nontender, nondistended, with normal bowel sounds Extremities: no cyanosis, clubbing, or edema Peripheral Pulses: 2+ in all extremities, 2+ femoral pulses bilaterally Neurologic: Alert and oriented X3; speech intact; face symmetrical; moves all extremities well  Cardiovascular Studies:    Echocardiogram 2D complete: 03/03/2015 INTERPRETATION NORMAL LEFT VENTRICULAR SYSTOLIC FUNCTION NORMAL RIGHT VENTRICULAR SYSTOLIC FUNCTION MILD VALVULAR REGURGITATION (See above) MILD VALVULAR STENOSIS (See above)   NM Myocardial Perfusion SPECT multiple (stress and rest):  Cardiac Catheterization:   Holter:  Cardiac CT Scan: 09/14/2022 IMPRESSION AND RECOMMENDATION:  1. Coronary calcium score of 122. This was 58th percentile for age  and sex matched control.   2. CAC 100-299 in LAD, RCA. CAC-DRS A2/N2.   3. Recommend aspirin and statin if no contraindication.   4. Continue heart healthy lifestyle and risk factor modification.   Cardiac MRI:   Assessment   68 y.o. male with  1. Coronary artery disease involving native coronary  artery of native heart without angina pectoris   2. Benign essential HTN   3. LVH (left ventricular hypertrophy) due to hypertensive disease, without heart failure   4. Mixed hyperlipidemia   5. Obstructive sleep apnea syndrome   6. Gastroesophageal reflux disease without esophagitis   7. Venous insufficiency of both lower extremities        Plan  Coronary artery disease mild stable continue conservative medical therapy Noncardiac chest pain conservative management Hypertension borderline controlled on amlodipine losartan consider adding diuretic or beta-blocker Hyperlipidemia patient currently on Pravachol therapy for lipid management Obstructive sleep apnea sleep study CPAP weight loss Obesity mild recommend modest weight loss exercise portion control GERD patient is on famotidine for reflux type symptoms consider adding omeprazole if symptoms persist Have the patient follow-up in 1 year     Return in about 1 year (around 09/05/2024).  This note is partially written by Leita Ellen, acting as the scribe of Dr. Cara Lovelace.  Leita Ellen

## 2023-10-08 NOTE — Progress Notes (Signed)
 Brett Vance is a  68 y.o. male who presents for  CHIEF COMPLAINT Chief Complaint  Patient presents with  . Follow-up  . Hypertension  . Hyperlipidemia  . Depression  . Hypogonadism    Subjective: History of Present Illness  Pt in NAD. HTN stable on meds. Has HLD on statin and depression on meds. Also with hypogonadism on testosterone . Weight stable. Feels OK. Sleeping well. No fever or HA's. Denies CP or SOB. No palpitations. No change in bowels or bladder. UTD with colon.    Past Medical History:  Diagnosis Date  . ADD (attention deficit disorder)   . Anemia 2015  . Arm pain, right 11/16/2014  . Benign essential HTN 02/12/2014  . BPH (benign prostatic hyperplasia)   . Chest pain   . Complete tear of right rotator cuff 04/29/2014  . Depression   . Dyslipidemia   . Dysphagia 08/06/2013  . Gastroesophageal reflux disease without esophagitis 02/22/2016  . Hyperlipidemia   . Hypertension   . Kidney stone   . Kidney stone   . LVH (left ventricular hypertrophy) due to hypertensive disease, without heart failure 09/25/2016  . Mixed hyperlipidemia 02/12/2014  . Nephrolithiasis   . Osteoarthritis   . Rotator cuff tendinitis, left 02/04/2015  . Sleep apnea    CPAP  . Syncopal episodes   . Tear of left rotator cuff, unspecified tear extent 05/03/2014  . Testosterone  deficiency   . Venous insufficiency of both lower extremities 09/25/2016   Patient Active Problem List  Diagnosis  . Dysphagia  . Sleep apnea  . ADD (attention deficit disorder)  . Depression  . BPH (benign prostatic hyperplasia)  . Nephrolithiasis  . Mixed hyperlipidemia  . Benign essential HTN  . Complete tear of right rotator cuff  . Tear of left rotator cuff, unspecified tear extent  . Rotator cuff tendinitis, left  . Gastroesophageal reflux disease without esophagitis  . LVH (left ventricular hypertrophy) due to hypertensive disease, without heart failure  . Venous insufficiency of both lower  extremities  . Hypogonadism male  . Coronary artery disease involving native coronary artery of native heart  . Personal history of colonic polyps  . FH: colon cancer    Past Surgical History:  Procedure Laterality Date  . LITHOTRIPSY  2003  . LAPAROSCOPIC INCISIONAL / UMBILICAL / VENTRAL HERNIA REPAIR  2009  . APPENDECTOMY  2009  . APPENDECTOMY  2010  . COLONOSCOPY  09/18/2011   [facility] (Mother) (Dr. Luba): CBF 08/2016; Recall Ltr mailed 11/20/2016 (dw)  . JOINT REPLACEMENT Left 05/2013   thumb joint replacement  . EGD  09/08/2013   Esophagitis: No repeat per [facility]  . EGD  11/13/2016   Gastritis: No repeat per [facility]TE  . COLONOSCOPY  10/14/2017   Adenomatous Polyps; [facility] (Mother) CBF 09/2020     Current Outpatient Medications:  .  ALPRAZolam (XANAX) 0.25 MG tablet, Take 0.25 mg by mouth as needed.  , Disp: , Rfl:  .  amLODIPine (NORVASC) 10 MG tablet, take 1 tablet by mouth daily, Disp: 90 tablet, Rfl: 3 .  ARIPiprazole (ABILIFY) 2 MG tablet, Take 2 mg by mouth once daily, Disp: , Rfl:  .  aspirin 81 MG EC tablet, Take 81 mg by mouth once daily., Disp: , Rfl:  .  famotidine (PEPCID) 40 MG tablet, TAKE 1 TABLET NIGHTLY, Disp: 90 tablet, Rfl: 3 .  imipramine (TOFRANIL) 10 MG tablet, Take 1 tablet (10 mg total) by mouth at bedtime, Disp: 30 tablet, Rfl: 11 .  losartan (COZAAR) 100 MG tablet, take 1 tablet by mouth daily, Disp: 90 tablet, Rfl: 3 .  multivitamin tablet, Take 1 tablet by mouth once daily., Disp: , Rfl:  .  pravastatin (PRAVACHOL) 40 MG tablet, Take 1 tablet (40 mg total) by mouth at bedtime, Disp: 90 tablet, Rfl: 3 .  sertraline  (ZOLOFT ) 50 MG tablet, Take 50 mg by mouth once daily, Disp: , Rfl:  .  tamsulosin  (FLOMAX ) 0.4 mg capsule, Take 0.4 mg by mouth once daily. Take 30 minutes after same meal each day., Disp: , Rfl:  .  testosterone  cypionate (DEPO-TESTOSTERONE ) 200 mg/mL injection, Inject 100 mg into the muscle once a week., Disp: , Rfl:  .  traMADoL (ULTRAM) 50 mg  tablet, Take 1 tablet (50 mg total) by mouth every 6 (six) hours as needed for Pain, Disp: 100 tablet, Rfl: 0 .  venlafaxine (EFFEXOR-XR) 75 MG XR capsule, Take 1 capsule by mouth once daily, Disp: 30 capsule, Rfl: 0 .  sildenafil  (REVATIO ) 20 mg tablet, Take 2-5 tablets by mouth continuously as needed., Disp: , Rfl:   Patient has no known allergies.  Social History   Socioeconomic History  . Marital status: Married  Tobacco Use  . Smoking status: Never  . Smokeless tobacco: Never  Vaping Use  . Vaping status: Never Used  Substance and Sexual Activity  . Alcohol use: No  . Drug use: No  . Sexual activity: Yes    Partners: Female    Comment: Im married   Social Drivers of Corporate investment banker Strain: Low Risk  (03/12/2023)   Overall Financial Resource Strain (CARDIA)   . Difficulty of Paying Living Expenses: Not hard at all  Food Insecurity: No Food Insecurity (03/12/2023)   Hunger Vital Sign   . Worried About Programme researcher, broadcasting/film/video in the Last Year: Never true   . Ran Out of Food in the Last Year: Never true  Transportation Needs: No Transportation Needs (03/12/2023)   PRAPARE - Transportation   . Lack of Transportation (Medical): No   . Lack of Transportation (Non-Medical): No  Housing Stability: Unknown (09/05/2023)   Housing Stability Vital Sign   . Unable to Pay for Housing in the Last Year: No   . Homeless in the Last Year: No    Family History  Problem Relation Name Age of Onset  . Colon cancer Mother Brett Vance   . Stomach cancer Maternal Grandfather    . Heart disease Father    . Lung cancer Father    . Heart disease Paternal Grandmother    . Heart disease Paternal Grandfather    . Inflammatory bowel disease Neg Hx      A comprehensive ROS was negative except for HPI  PE: BP 132/72   Pulse 67   Ht 172.7 cm (5' 8)   Wt 96.6 kg (213 lb)   SpO2 94%   BMI 32.39 kg/m  General. Alert oriented x3  Eyes. Sclera and conjunctiva clear; pupils  equal round and reactive to light and accommodation; extraocular movements intact Nose. Mucosa healthy without drainage or ulceration Oropharynx. No suspicious lesions Neck. No swelling, masses, stiffness, pain, limited movement, carotid pulses normal bilaterally, thyroid normal size, no masses palpated.  No bruits Lungs. Respirations unlabored; clear to auscultation bilaterally Back. No spinal deformity Cardiovascular. Heart regular rate and rhythm without murmurs, gallops, or rubs Abdomen. Soft; non tender; non distended; normoactive bowel sounds; no masses or organomegaly Lymph Nodes. No significant cervical, supraclavicular, axillary or  inguinal lymphadenopathy noted Musculoskeletal. No deformities; no active joint inflammation Extremities. Normal, no edema Pulses. Dorsalis pedis palpable and symmetric bilaterally Neurologic. Alert and oriented; speech intact; face symmetrical; moves all extremities well  Appointment on 09/05/2023  Component Date Value Ref Range Status  . Cholesterol, Total 09/05/2023 205 (H)  100 - 200 mg/dL Final  . Triglyceride 93/94/7974 111  35 - 199 mg/dL Final  . HDL (High Density Lipoprotein) Cho* 09/05/2023 61.8  29.0 - 71.0 mg/dL Final  . LDL Calculated 09/05/2023 878  0 - 130 mg/dL Final  . VLDL Cholesterol 09/05/2023 22  mg/dL Final  . Cholesterol/HDL Ratio 09/05/2023 3.3   Final  . WBC (White Blood Cell Count) 09/05/2023 5.9  4.1 - 10.2 10^3/uL Final  . RBC (Red Blood Cell Count) 09/05/2023 4.71  4.69 - 6.13 10^6/uL Final  . Hemoglobin 09/05/2023 14.2  14.1 - 18.1 gm/dL Final  . Hematocrit 93/94/7974 41.3  40.0 - 52.0 % Final  . MCV (Mean Corpuscular Volume) 09/05/2023 87.7  80.0 - 100.0 fl Final  . MCH (Mean Corpuscular Hemoglobin) 09/05/2023 30.1  27.0 - 31.2 pg Final  . MCHC (Mean Corpuscular Hemoglobin * 09/05/2023 34.4  32.0 - 36.0 gm/dL Final  . Platelet Count 09/05/2023 183  150 - 450 10^3/uL Final  . RDW-CV (Red Cell Distribution Widt*  09/05/2023 12.6  11.6 - 14.8 % Final  . MPV (Mean Platelet Volume) 09/05/2023 9.6  9.4 - 12.4 fl Final  . Neutrophils 09/05/2023 3.53  1.50 - 7.80 10^3/uL Final  . Lymphocytes 09/05/2023 1.41  1.00 - 3.60 10^3/uL Final  . Monocytes 09/05/2023 0.57  0.00 - 1.50 10^3/uL Final  . Eosinophils 09/05/2023 0.28  0.00 - 0.55 10^3/uL Final  . Basophils 09/05/2023 0.05  0.00 - 0.09 10^3/uL Final  . Neutrophil % 09/05/2023 60.1  32.0 - 70.0 % Final  . Lymphocyte % 09/05/2023 24.0  10.0 - 50.0 % Final  . Monocyte % 09/05/2023 9.7  4.0 - 13.0 % Final  . Eosinophil % 09/05/2023 4.8  1.0 - 5.0 % Final  . Basophil% 09/05/2023 0.9  0.0 - 2.0 % Final  . Immature Granulocyte % 09/05/2023 0.5  <=0.7 % Final  . Immature Granulocyte Count 09/05/2023 0.03  <=0.06 10^3/L Final  . Glucose 09/05/2023 95  70 - 110 mg/dL Final  . Sodium 93/94/7974 141  136 - 145 mmol/L Final  . Potassium 09/05/2023 4.0  3.6 - 5.1 mmol/L Final  . Chloride 09/05/2023 106  97 - 109 mmol/L Final  . Carbon Dioxide (CO2) 09/05/2023 28.2  22.0 - 32.0 mmol/L Final  . Urea Nitrogen (BUN) 09/05/2023 18  7 - 25 mg/dL Final  . Creatinine 93/94/7974 1.0  0.7 - 1.3 mg/dL Final  . Glomerular Filtration Rate (eGFR) 09/05/2023 83  >60 mL/min/1.73sq m Final  . Calcium 09/05/2023 9.2  8.7 - 10.3 mg/dL Final  . AST  93/94/7974 26  8 - 39 U/L Final  . ALT  09/05/2023 33  6 - 57 U/L Final  . Alk Phos (alkaline Phosphatase) 09/05/2023 68  34 - 104 U/L Final  . Albumin 09/05/2023 4.2  3.5 - 4.8 g/dL Final  . Bilirubin, Total 09/05/2023 1.0  0.3 - 1.2 mg/dL Final  . Protein, Total 09/05/2023 6.3  6.1 - 7.9 g/dL Final  . A/G Ratio 93/94/7974 2.0  1.0 - 5.0 gm/dL Final  Appointment on 87/94/7975  Component Date Value Ref Range Status  . Cholesterol, Total 03/07/2023 160  100 - 200 mg/dL Final  .  Triglyceride 03/07/2023 78  35 - 199 mg/dL Final  . HDL (High Density Lipoprotein) Cho* 03/07/2023 65.2  29.0 - 71.0 mg/dL Final  . LDL Calculated 03/07/2023  79  0 - 130 mg/dL Final  . VLDL Cholesterol 03/07/2023 16  mg/dL Final  . Cholesterol/HDL Ratio 03/07/2023 2.5   Final  . WBC (White Blood Cell Count) 03/07/2023 5.1  4.1 - 10.2 10^3/uL Final  . RBC (Red Blood Cell Count) 03/07/2023 4.63 (L)  4.69 - 6.13 10^6/uL Final  . Hemoglobin 03/07/2023 14.1  14.1 - 18.1 gm/dL Final  . Hematocrit 87/94/7975 40.6  40.0 - 52.0 % Final  . MCV (Mean Corpuscular Volume) 03/07/2023 87.7  80.0 - 100.0 fl Final  . MCH (Mean Corpuscular Hemoglobin) 03/07/2023 30.5  27.0 - 31.2 pg Final  . MCHC (Mean Corpuscular Hemoglobin * 03/07/2023 34.7  32.0 - 36.0 gm/dL Final  . Platelet Count 03/07/2023 174  150 - 450 10^3/uL Final  . RDW-CV (Red Cell Distribution Widt* 03/07/2023 12.5  11.6 - 14.8 % Final  . MPV (Mean Platelet Volume) 03/07/2023 9.5  9.4 - 12.4 fl Final  . Neutrophils 03/07/2023 3.00  1.50 - 7.80 10^3/uL Final  . Lymphocytes 03/07/2023 1.33  1.00 - 3.60 10^3/uL Final  . Monocytes 03/07/2023 0.56  0.00 - 1.50 10^3/uL Final  . Eosinophils 03/07/2023 0.15  0.00 - 0.55 10^3/uL Final  . Basophils 03/07/2023 0.03  0.00 - 0.09 10^3/uL Final  . Neutrophil % 03/07/2023 59.0  32.0 - 70.0 % Final  . Lymphocyte % 03/07/2023 26.1  10.0 - 50.0 % Final  . Monocyte % 03/07/2023 11.0  4.0 - 13.0 % Final  . Eosinophil % 03/07/2023 2.9  1.0 - 5.0 % Final  . Basophil% 03/07/2023 0.6  0.0 - 2.0 % Final  . Immature Granulocyte % 03/07/2023 0.4  <=0.7 % Final  . Immature Granulocyte Count 03/07/2023 0.02  <=0.06 10^3/L Final  . Glucose 03/07/2023 90  70 - 110 mg/dL Final  . Sodium 87/94/7975 138  136 - 145 mmol/L Final  . Potassium 03/07/2023 4.2  3.6 - 5.1 mmol/L Final  . Chloride 03/07/2023 103  97 - 109 mmol/L Final  . Carbon Dioxide (CO2) 03/07/2023 28.0  22.0 - 32.0 mmol/L Final  . Urea Nitrogen (BUN) 03/07/2023 26 (H)  7 - 25 mg/dL Final  . Creatinine 87/94/7975 1.2  0.7 - 1.3 mg/dL Final  . Glomerular Filtration Rate (eGFR) 03/07/2023 67  >60 mL/min/1.73sq m  Final  . Calcium 03/07/2023 9.4  8.7 - 10.3 mg/dL Final  . AST  87/94/7975 31  8 - 39 U/L Final  . ALT  03/07/2023 51  6 - 57 U/L Final  . Alk Phos (alkaline Phosphatase) 03/07/2023 66  34 - 104 U/L Final  . Albumin 03/07/2023 4.2  3.5 - 4.8 g/dL Final  . Bilirubin, Total 03/07/2023 1.1  0.3 - 1.2 mg/dL Final  . Protein, Total 03/07/2023 6.4  6.1 - 7.9 g/dL Final  . A/G Ratio 87/94/7975 1.9  1.0 - 5.0 gm/dL Final  . Thyroid Stimulating Hormone (TSH) 03/07/2023 3.360  0.450-5.330 uIU/ml uIU/mL Final  . Color 03/07/2023 Light Yellow  Colorless, Straw, Light Yellow, Yellow, Dark Yellow Final  . Clarity 03/07/2023 Clear  Clear Final  . Specific Gravity 03/07/2023 1.018  1.005 - 1.030 Final  . pH, Urine 03/07/2023 6.0  5.0 - 8.0 Final  . Protein, Urinalysis 03/07/2023 Negative  Negative mg/dL Final  . Glucose, Urinalysis 03/07/2023 Negative  Negative mg/dL Final  .  Ketones, Urinalysis 03/07/2023 Negative  Negative mg/dL Final  . Blood, Urinalysis 03/07/2023 Negative  Negative Final  . Nitrite, Urinalysis 03/07/2023 Negative  Negative Final  . Leukocyte Esterase, Urinalysis 03/07/2023 Negative  Negative Final  . Bilirubin, Urinalysis 03/07/2023 Negative  Negative Final  . Urobilinogen, Urinalysis 03/07/2023 0.2  0.2 - 1.0 mg/dL Final  . WBC, UA 87/94/7975 <1  <=5 /hpf Final  . Red Blood Cells, Urinalysis 03/07/2023 2  <=3 /hpf Final  . Bacteria, Urinalysis 03/07/2023 0-5  0 - 5 /hpf Final  . Squamous Epithelial Cells, Urinaly* 03/07/2023 0  /hpf Final   DIAGNOSIS: Benign essential HTN  (primary encounter diagnosis)  Mixed hyperlipidemia  Hypogonadism male  Current moderate episode of major depressive disorder, unspecified whether recurrent (CMS/HHS-HCC)   PLAN: HTN- stable, same meds HLD- diet/exercise, labs 6 mo Hypogonadism- same dose, labs 6 mo Depression- stable, meds refilled RTC 6 mo, sooner if needed     Attestation Statement:   I personally performed the service.  (TP)  Reyes JONETTA Costa, MD, MD

## 2023-10-31 ENCOUNTER — Other Ambulatory Visit: Payer: Self-pay | Admitting: Urology

## 2023-10-31 DIAGNOSIS — N401 Enlarged prostate with lower urinary tract symptoms: Secondary | ICD-10-CM

## 2023-11-12 ENCOUNTER — Other Ambulatory Visit: Payer: Self-pay | Admitting: Urology

## 2024-01-03 ENCOUNTER — Other Ambulatory Visit: Payer: BC Managed Care – PPO

## 2024-01-03 DIAGNOSIS — E291 Testicular hypofunction: Secondary | ICD-10-CM

## 2024-01-04 LAB — HEMOGLOBIN AND HEMATOCRIT, BLOOD
Hematocrit: 41.5 % (ref 37.5–51.0)
Hemoglobin: 13.5 g/dL (ref 13.0–17.7)

## 2024-01-04 LAB — TESTOSTERONE: Testosterone: 355 ng/dL (ref 264–916)

## 2024-01-06 ENCOUNTER — Ambulatory Visit: Payer: BC Managed Care – PPO | Admitting: Urology

## 2024-01-08 ENCOUNTER — Ambulatory Visit: Payer: Self-pay | Admitting: Urology

## 2024-01-18 ENCOUNTER — Other Ambulatory Visit: Payer: Self-pay | Admitting: Urology

## 2024-01-18 DIAGNOSIS — N401 Enlarged prostate with lower urinary tract symptoms: Secondary | ICD-10-CM

## 2024-01-31 ENCOUNTER — Encounter: Payer: Self-pay | Admitting: Urology

## 2024-01-31 ENCOUNTER — Ambulatory Visit: Admitting: Urology

## 2024-01-31 VITALS — BP 129/77 | HR 61 | Ht 68.0 in | Wt 200.0 lb

## 2024-01-31 DIAGNOSIS — R3912 Poor urinary stream: Secondary | ICD-10-CM | POA: Diagnosis not present

## 2024-01-31 DIAGNOSIS — E291 Testicular hypofunction: Secondary | ICD-10-CM

## 2024-01-31 DIAGNOSIS — N401 Enlarged prostate with lower urinary tract symptoms: Secondary | ICD-10-CM | POA: Diagnosis not present

## 2024-01-31 LAB — URINALYSIS, COMPLETE
Bilirubin, UA: NEGATIVE
Glucose, UA: NEGATIVE
Ketones, UA: NEGATIVE
Leukocytes,UA: NEGATIVE
Nitrite, UA: NEGATIVE
Protein,UA: NEGATIVE
RBC, UA: NEGATIVE
Specific Gravity, UA: 1.02 (ref 1.005–1.030)
Urobilinogen, Ur: 1 mg/dL (ref 0.2–1.0)
pH, UA: 6 (ref 5.0–7.5)

## 2024-01-31 LAB — MICROSCOPIC EXAMINATION

## 2024-01-31 LAB — BLADDER SCAN AMB NON-IMAGING: Scan Result: 18

## 2024-01-31 MED ORDER — GEMTESA 75 MG PO TABS: 1.0000 | ORAL_TABLET | Freq: Every day | ORAL | Status: DC

## 2024-01-31 NOTE — Progress Notes (Addendum)
 01/31/2024 10:23 AM   Brett Vance. [birth year] [id number]  Referring provider: Auston Reyes BIRCH, MD [address] [facility] [place],  [place] [address]  Chief Complaint  Patient presents with   Hypogonadism   Urologic history:  1.  Hypogonadism TRT 100 mg testosterone  cypionate weekly   2.  Erectile dysfunction sildenafil  prn   3.  BPH with lower urinary tract symptoms Tamsulosin  0.4 mg daily   HPI: Brett Vance. is a 68 y.o. male presents for annual follow-up.  2-3 weeks ago noted increased urinary frequency, urgency with urge incontinence.  He has had 1-3 episodes of urge incontinence at night Denies dysuria, gross hematuria Remains on tamsulosin ; had increased to 2 capsules and felt his symptoms were worse Remains on TRT with good energy level/libido Denies flank, abdominal or pelvic pain.  Labs 01/03/2024: Testosterone  355, H/H13.5/41.5, PSA was not drawn      PSA trend   Prostate Specific Ag, Serum  Latest Ref Rng 0.0 - 4.0 ng/mL  12/02/2018 1.1   06/04/2019 1.6   12/01/2019 1.6   12/09/2020 1.1   06/13/2021 1.0   12/14/2021 1.2   12/31/2022 1.0     PMH: Past Medical History:  Diagnosis Date   ADD (attention deficit disorder)    Arthritis    BPH (benign prostatic hyperplasia)    Depression    History of kidney stones    Hypertension    Kidney stone    LVH (left ventricular hypertrophy) 02/12/2014   due to hypertensive disease, without heart failure   Nephrolithiasis    Sleep apnea    CPAP   Sleep related gastroesophageal reflux disease    Testosterone  deficiency    Venous insufficiency of both lower extremities 09/25/2016    Surgical History: Past Surgical History:  Procedure Laterality Date   APPENDECTOMY     2009   athroplasty on left hand Left 3 years ago   COLONOSCOPY WITH PROPOFOL  N/A 10/14/2017   Procedure: COLONOSCOPY WITH PROPOFOL ;  Surgeon: Brett Lamar DASEN, MD;  Location: [facility] ENDOSCOPY;  Service: Endoscopy;   Laterality: N/A;   HERNIA REPAIR  2009   Laparascopic incisional/umbilical/ventral hernia repair   JOINT REPLACEMENT Left 05/2013   LITHOTRIPSY  2003    Home Medications:  Allergies as of 01/31/2024   No Known Allergies      Medication List        Accurate as of January 31, 2024 10:23 AM. If you have any questions, ask your nurse or doctor.          ALPRAZolam 0.25 MG tablet Commonly known as: XANAX Take 0.25 mg by mouth as needed for anxiety.   amLODipine 10 MG tablet Commonly known as: NORVASC Take 10 mg by mouth daily.   ARIPiprazole 2 MG tablet Commonly known as: ABILIFY Take 2 mg by mouth daily.   aspirin EC 81 MG tablet Take 81 mg by mouth daily.   famotidine 40 MG tablet Commonly known as: PEPCID Take 40 mg by mouth daily.   losartan 100 MG tablet Commonly known as: COZAAR Take 100 mg by mouth daily.   multivitamin tablet Take 1 tablet by mouth daily.   omeprazole 40 MG capsule Commonly known as: PRILOSEC omeprazole 40 mg capsule,delayed release   sertraline  50 MG tablet Commonly known as: ZOLOFT  Take 1 tablet (50 mg total) by mouth daily.   sildenafil  20 MG tablet Commonly known as: REVATIO  TAKE 2 TO 5 TABLETS BY MOUTH AS NEEDED FOR  ERECTILE DYSFUNCTION   tamsulosin  0.4 MG Caps capsule Commonly known as: FLOMAX  TAKE 2 CAPSULES BY MOUTH ONCE  DAILY 1/2 HOUR AFTER THE SAME  MEAL EACH DAY   testosterone  cypionate 200 MG/ML injection Commonly known as: DEPOTESTOSTERONE CYPIONATE INJECT 1/2 ML INTRAMUSCULARLY ONCE A WEEK   traMADol 50 MG tablet Commonly known as: ULTRAM Take 50 mg by mouth every 6 (six) hours as needed.   valsartan 160 MG tablet Commonly known as: DIOVAN Take 160 mg by mouth 2 (two) times daily.   zolpidem 10 MG tablet Commonly known as: AMBIEN        Allergies: No Known Allergies  Family History: Family History  Problem Relation Age of Onset   Cancer Mother    Cancer Father    Heart disease Father      Social History:  reports that he has never smoked. He has never used smokeless tobacco. He reports that he does not drink alcohol and does not use drugs.   Physical Exam: BP 129/77   Pulse 61   Ht 5' 8 (1.727 m)   Wt 200 lb (90.7 kg)   BMI 30.41 kg/m   Constitutional:  Alert, No acute distress. HEENT: [place] AT Respiratory: Normal respiratory effort, no increased work of breathing. Psychiatric: Normal mood and affect.  Laboratory Data:  Urinalysis Dipstick/microscopy negative  Assessment & Plan:    1.  BPH with LUTS Worsening storage related voiding symptoms with urge incontinence UA today negative PVR: 18 mL Add Gemtesa 75 mg daily-samples given and he will call back regarding efficacy  2.  Hypogonadism Stable  3.  Prostate cancer screening Annual PSA was not drawn with his blood work earlier this month States he has an appoint with Dr. Auston in January and will get his PSA drawn at that time   Brett JAYSON Barba, MD  [facility] [address], [address] [place], [place] [address] [phone]

## 2024-02-04 ENCOUNTER — Ambulatory Visit: Admitting: Urology

## 2024-02-20 ENCOUNTER — Other Ambulatory Visit: Payer: Self-pay | Admitting: *Deleted

## 2024-02-20 DIAGNOSIS — N401 Enlarged prostate with lower urinary tract symptoms: Secondary | ICD-10-CM

## 2024-02-20 DIAGNOSIS — N5201 Erectile dysfunction due to arterial insufficiency: Secondary | ICD-10-CM

## 2024-02-20 MED ORDER — GEMTESA 75 MG PO TABS: 1.0000 | ORAL_TABLET | Freq: Every day | ORAL | 11 refills | Status: AC

## 2024-02-20 MED ORDER — SILDENAFIL CITRATE 20 MG PO TABS: ORAL_TABLET | ORAL | 0 refills | Status: AC
